# Patient Record
Sex: Male | Born: 1935 | Race: White | Hispanic: No | Marital: Married | Smoking: Never smoker
Health system: Southern US, Community
[De-identification: ages and names within clinical notes are randomized; demographics above are authoritative.]

## PROBLEM LIST (undated history)

## (undated) ENCOUNTER — Emergency Department (HOSPITAL_COMMUNITY): Admission: EM | Payer: Medicare Other

## (undated) DIAGNOSIS — I739 Peripheral vascular disease, unspecified: Secondary | ICD-10-CM

## (undated) DIAGNOSIS — I639 Cerebral infarction, unspecified: Secondary | ICD-10-CM

## (undated) DIAGNOSIS — E785 Hyperlipidemia, unspecified: Secondary | ICD-10-CM

## (undated) DIAGNOSIS — I10 Essential (primary) hypertension: Secondary | ICD-10-CM

## (undated) DIAGNOSIS — J45909 Unspecified asthma, uncomplicated: Secondary | ICD-10-CM

## (undated) DIAGNOSIS — M1990 Unspecified osteoarthritis, unspecified site: Secondary | ICD-10-CM

## (undated) DIAGNOSIS — N400 Enlarged prostate without lower urinary tract symptoms: Secondary | ICD-10-CM

## (undated) DIAGNOSIS — E039 Hypothyroidism, unspecified: Secondary | ICD-10-CM

## (undated) DIAGNOSIS — E782 Mixed hyperlipidemia: Secondary | ICD-10-CM

## (undated) DIAGNOSIS — I253 Aneurysm of heart: Secondary | ICD-10-CM

## (undated) DIAGNOSIS — I779 Disorder of arteries and arterioles, unspecified: Secondary | ICD-10-CM

## (undated) HISTORY — DX: Disorder of arteries and arterioles, unspecified: I77.9

## (undated) HISTORY — DX: Aneurysm of heart: I25.3

## (undated) HISTORY — DX: Peripheral vascular disease, unspecified: I73.9

## (undated) HISTORY — DX: Essential (primary) hypertension: I10

## (undated) HISTORY — PX: ABDOMINAL SURGERY: SHX537

## (undated) HISTORY — DX: Mixed hyperlipidemia: E78.2

## (undated) HISTORY — DX: Benign prostatic hyperplasia without lower urinary tract symptoms: N40.0

## (undated) HISTORY — PX: PROSTATE SURGERY: SHX751

---

## 2004-08-31 ENCOUNTER — Ambulatory Visit (HOSPITAL_COMMUNITY): Admission: RE | Admit: 2004-08-31 | Discharge: 2004-08-31 | Payer: Self-pay | Admitting: Family Medicine

## 2004-09-09 ENCOUNTER — Ambulatory Visit: Payer: Self-pay | Admitting: *Deleted

## 2009-11-16 ENCOUNTER — Ambulatory Visit (HOSPITAL_COMMUNITY): Admission: RE | Admit: 2009-11-16 | Discharge: 2009-11-16 | Payer: Self-pay | Admitting: Family Medicine

## 2010-04-18 ENCOUNTER — Ambulatory Visit (HOSPITAL_COMMUNITY)
Admission: RE | Admit: 2010-04-18 | Discharge: 2010-04-18 | Payer: Self-pay | Source: Home / Self Care | Admitting: Family Medicine

## 2010-06-28 ENCOUNTER — Ambulatory Visit (HOSPITAL_COMMUNITY)
Admission: RE | Admit: 2010-06-28 | Discharge: 2010-06-28 | Payer: Self-pay | Source: Home / Self Care | Attending: Family Medicine | Admitting: Family Medicine

## 2010-10-28 NOTE — Procedures (Signed)
NAME:  [name], [name] NO.:  [id number]   MEDICAL RECORD NO.:  [id number]          PATIENT TYPE:  OUT   LOCATION:  RAD                           FACILITY:  [facility]   PHYSICIAN:  Nicki Guadalajara, M.D.     DATE OF BIRTH:  05-14-1933   DATE OF PROCEDURE:  08/31/2004  DATE OF DISCHARGE:                                  ECHOCARDIOGRAM   INDICATION:  [name] is a 77 year old gentleman, patient of Dr.  Renard Matter, who has a history of hypertension, recent syncope, and dizziness.  He is referred for 2-D echo Doppler study.   1.  Technically inadequate M-mode, 2-dimensional, and comprehensive Doppler      study.  2.  There was evidence for concentric left ventricular hypertrophy with      septal wall thickness measuring 1.7 cm, and posterior wall thickness      measuring 1.5 cm, placing it in a moderate range. Left ventricular end      diastolic and end systolic dimensions are normal at 3.4 and 2.7 cm,      respectively. There is normal systolic function with an estimated      ejection fraction of approximately 55%. The mitral inflow signal      suggests a grade 1 diastolic dysfunction.  3.  Left atrium is upper normal in size, measuring 3.9 cm. Right atrium and      right ventricle are normal.  4.  Aortic root dimension was normal at 3.2 cm.  5.  The aortic valve was trileaflet. There was commissural thickening of the      leaflets with trace aortic regurgitation. Peak instantaneous gradient is      10 mm, mean gradient is 6 mm compatible with aortic sclerosis, minimal      aortic stenosis.  6.  There is a suggestion of mild mitral valve prolapse with trace mitral      regurgitation.  7.  Tricuspid valve is structurally normal. Pulmonic valve is structurally      normal.  8.  There are no intramyocardial masses, thrombi, or effusions.   IMPRESSION:  Technically inadequate echo Doppler study demonstrating  moderate concentric left ventricular hypertrophy with normal  systolic  function, but evidence for mild grade 1 diastolic dysfunction, and an  estimated ejection fraction of 55%. There is evidence for aortic valve  sclerosis without significant stenosis with commissural thickening of a  trileaflet valve with trace aortic insufficiency. There is a suggestion of  mild mitral valve prolapse with trace mitral regurgitation.     TK/MEDQ  D:  08/31/2004  T:  09/01/2004  Job:  [id number]   cc:   Angus G. Renard Matter, MD  [address]  [place]  [place] [address]  Fax: [phone]

## 2011-03-07 ENCOUNTER — Ambulatory Visit (HOSPITAL_COMMUNITY)
Admission: RE | Admit: 2011-03-07 | Discharge: 2011-03-07 | Disposition: A | Discharge status: Home / Self Care | Payer: Medicare Other | Source: Ambulatory Visit | Attending: Family Medicine | Admitting: Family Medicine

## 2011-03-07 ENCOUNTER — Other Ambulatory Visit (HOSPITAL_COMMUNITY): Payer: Self-pay | Admitting: Family Medicine

## 2011-03-07 DIAGNOSIS — R52 Pain, unspecified: Secondary | ICD-10-CM

## 2011-03-07 DIAGNOSIS — M25569 Pain in unspecified knee: Secondary | ICD-10-CM | POA: Insufficient documentation

## 2011-03-07 DIAGNOSIS — M25469 Effusion, unspecified knee: Secondary | ICD-10-CM | POA: Insufficient documentation

## 2011-05-07 ENCOUNTER — Other Ambulatory Visit: Payer: Self-pay

## 2011-05-07 ENCOUNTER — Emergency Department (HOSPITAL_COMMUNITY): Payer: Medicare Other

## 2011-05-07 ENCOUNTER — Encounter: Payer: Self-pay | Admitting: *Deleted

## 2011-05-07 ENCOUNTER — Inpatient Hospital Stay (HOSPITAL_COMMUNITY)
Admission: EM | Admit: 2011-05-07 | Discharge: 2011-05-10 | DRG: 066 | Disposition: A | Discharge status: Home Health | Payer: Medicare Other | Attending: Internal Medicine | Admitting: Internal Medicine

## 2011-05-07 DIAGNOSIS — E039 Hypothyroidism, unspecified: Secondary | ICD-10-CM | POA: Diagnosis present

## 2011-05-07 DIAGNOSIS — Z8673 Personal history of transient ischemic attack (TIA), and cerebral infarction without residual deficits: Secondary | ICD-10-CM

## 2011-05-07 DIAGNOSIS — N289 Disorder of kidney and ureter, unspecified: Secondary | ICD-10-CM | POA: Diagnosis present

## 2011-05-07 DIAGNOSIS — I639 Cerebral infarction, unspecified: Secondary | ICD-10-CM

## 2011-05-07 DIAGNOSIS — R209 Unspecified disturbances of skin sensation: Secondary | ICD-10-CM | POA: Diagnosis present

## 2011-05-07 DIAGNOSIS — I10 Essential (primary) hypertension: Secondary | ICD-10-CM | POA: Diagnosis present

## 2011-05-07 DIAGNOSIS — E890 Postprocedural hypothyroidism: Secondary | ICD-10-CM | POA: Diagnosis present

## 2011-05-07 DIAGNOSIS — Z7982 Long term (current) use of aspirin: Secondary | ICD-10-CM

## 2011-05-07 DIAGNOSIS — I6529 Occlusion and stenosis of unspecified carotid artery: Secondary | ICD-10-CM | POA: Diagnosis present

## 2011-05-07 DIAGNOSIS — I6340 Cerebral infarction due to embolism of unspecified cerebral artery: Principal | ICD-10-CM | POA: Diagnosis present

## 2011-05-07 DIAGNOSIS — N400 Enlarged prostate without lower urinary tract symptoms: Secondary | ICD-10-CM | POA: Diagnosis present

## 2011-05-07 HISTORY — DX: Cerebral infarction, unspecified: I63.9

## 2011-05-07 HISTORY — DX: Hypothyroidism, unspecified: E03.9

## 2011-05-07 LAB — PROTIME-INR: Prothrombin Time: 14.9 seconds (ref 11.6–15.2)

## 2011-05-07 LAB — DIFFERENTIAL
Basophils Relative: 1 % (ref 0–1)
Eosinophils Absolute: 0.3 10*3/uL (ref 0.0–0.7)
Lymphs Abs: 2.6 10*3/uL (ref 0.7–4.0)
Monocytes Relative: 9 % (ref 3–12)
Neutro Abs: 3.8 10*3/uL (ref 1.7–7.7)
Neutrophils Relative %: 52 % (ref 43–77)

## 2011-05-07 LAB — CBC
Hemoglobin: 15.3 g/dL (ref 13.0–17.0)
Platelets: 213 10*3/uL (ref 150–400)
RBC: 4.93 MIL/uL (ref 4.22–5.81)

## 2011-05-07 LAB — COMPREHENSIVE METABOLIC PANEL
ALT: 29 U/L (ref 0–53)
Albumin: 3.8 g/dL (ref 3.5–5.2)
Alkaline Phosphatase: 59 U/L (ref 39–117)
BUN: 28 mg/dL — ABNORMAL HIGH (ref 6–23)
Chloride: 103 mEq/L (ref 96–112)
Glucose, Bld: 101 mg/dL — ABNORMAL HIGH (ref 70–99)
Potassium: 4 mEq/L (ref 3.5–5.1)
Sodium: 136 mEq/L (ref 135–145)
Total Bilirubin: 0.3 mg/dL (ref 0.3–1.2)
Total Protein: 6.6 g/dL (ref 6.0–8.3)

## 2011-05-07 NOTE — ED Notes (Signed)
CT complete, pt waiting on tele-neurology consult. Camera in room at this time.

## 2011-05-07 NOTE — Consult Note (Signed)
Reason for Consult:  Stroke Referring Physician: [facility]  Leonard Lindsey is an 77 y.o. male.  HPI: Patient had acute onset of left arm numbness and incoordination at about 6 pm.  There was no facial involvement.  The wife says that his gait appeared unsteady and he was walking into the walls.  He has a history of stroke 5 years ago at which time his gait was unsteady as well.  He was fully compliant with ASA prophylaxis when this occurred.  He went to [facility] where a telestroke consult without another neurologist was performed.  He was deemed not to be a tpa candidate due to recent hematuria within the last week.   CT scan of the brain was obtained which I reviewed personally.  There is a large right parietal subcortical hypodensity, which has some acute features but may be old as well.  No hemorrhage is present.  CBC, CMP, and coag are normal except for slightly elevated BUN 28 and Cr 1.37.    Past Medical History  Diagnosis Date  . Hypertension   . Stroke   . Hypothyroid     History reviewed. No pertinent past surgical history.  History reviewed. No pertinent family history.  Social History:  reports that he has never smoked. He does not have any smokeless tobacco history on file. He reports that he does not drink alcohol or use illicit drugs.  Allergies: No Known Allergies  Prior to Admission medications   Medication Sig Start Date End Date Taking? Authorizing Provider  aspirin 81 MG tablet Take 162 mg by mouth at bedtime.    Yes Historical Provider, MD  atorvastatin (LIPITOR) 40 MG tablet Take 40 mg by mouth at bedtime.     Yes Historical Provider, MD  cyanocobalamin (,VITAMIN B-12,) 1000 MCG/ML injection Inject 1,000 mcg into the muscle every 30 (thirty) days.     Yes Historical Provider, MD  diltiazem (CARDIZEM CD) 180 MG 24 hr capsule Take 180 mg by mouth every morning.     Yes Historical Provider, MD  dutasteride (AVODART) 0.5 MG capsule Take 0.5 mg by mouth at  bedtime.    Yes Historical Provider, MD  ibuprofen (ADVIL,MOTRIN) 800 MG tablet Take 800 mg by mouth 2 (two) times daily.    Yes Historical Provider, MD  levothyroxine (SYNTHROID, LEVOTHROID) 137 MCG tablet Take 137 mcg by mouth every morning.    Yes Historical Provider, MD  ramipril (ALTACE) 1.25 MG capsule Take 1.25 mg by mouth every morning.    Yes Historical Provider, MD  Tamsulosin HCl (FLOMAX) 0.4 MG CAPS Take 0.4 mg by mouth at bedtime.    Yes Historical Provider, MD  triamterene-hydrochlorothiazide (DYAZIDE) 37.5-25 MG per capsule Take 1 capsule by mouth every morning.     Yes Historical Provider, MD    Medications: I have reviewed the patient's current medications.  Results for orders placed during the hospital encounter of 05/07/11 (from the past 48 hour(s))  CBC     Status: Normal   Collection Time   05/07/11  7:00 PM      Component Value Range Comment   WBC 7.4  4.0 - 10.5 (K/uL)    RBC 4.93  4.22 - 5.81 (MIL/uL)    Hemoglobin 15.3  13.0 - 17.0 (g/dL)    HCT 19.1  47.8 - 29.5 (%)    MCV 90.1  78.0 - 100.0 (fL)    MCH 31.0  26.0 - 34.0 (pg)    MCHC 34.5  30.0 - 36.0 (g/dL)    RDW 16.1  09.6 - 04.5 (%)    Platelets 213  150 - 400 (K/uL)   DIFFERENTIAL     Status: Normal   Collection Time   05/07/11  7:00 PM      Component Value Range Comment   Neutrophils Relative 52  43 - 77 (%)    Neutro Abs 3.8  1.7 - 7.7 (K/uL)    Lymphocytes Relative 35  12 - 46 (%)    Lymphs Abs 2.6  0.7 - 4.0 (K/uL)    Monocytes Relative 9  3 - 12 (%)    Monocytes Absolute 0.7  0.1 - 1.0 (K/uL)    Eosinophils Relative 3  0 - 5 (%)    Eosinophils Absolute 0.3  0.0 - 0.7 (K/uL)    Basophils Relative 1  0 - 1 (%)    Basophils Absolute 0.1  0.0 - 0.1 (K/uL)   COMPREHENSIVE METABOLIC PANEL     Status: Abnormal   Collection Time   05/07/11  7:00 PM      Component Value Range Comment   Sodium 136  135 - 145 (mEq/L)    Potassium 4.0  3.5 - 5.1 (mEq/L)    Chloride 103  96 - 112 (mEq/L)    CO2 25   19 - 32 (mEq/L)    Glucose, Bld 101 (*) 70 - 99 (mg/dL)    BUN 28 (*) 6 - 23 (mg/dL)    Creatinine, Ser 4.09 (*) 0.50 - 1.35 (mg/dL)    Calcium 9.3  8.4 - 10.5 (mg/dL)    Total Protein 6.6  6.0 - 8.3 (g/dL)    Albumin 3.8  3.5 - 5.2 (g/dL)    AST 30  0 - 37 (U/L)    ALT 29  0 - 53 (U/L)    Alkaline Phosphatase 59  39 - 117 (U/L)    Total Bilirubin 0.3  0.3 - 1.2 (mg/dL)    GFR calc non Af Amer 49 (*) >90 (mL/min)    GFR calc Af Amer 56 (*) >90 (mL/min)   PROTIME-INR     Status: Normal   Collection Time   05/07/11  7:00 PM      Component Value Range Comment   Prothrombin Time 14.9  11.6 - 15.2 (seconds)    INR 1.15  0.00 - 1.49    APTT     Status: Normal   Collection Time   05/07/11  7:00 PM      Component Value Range Comment   aPTT 32  24 - 37 (seconds)     Ct Head Wo Contrast  05/07/2011  *RADIOLOGY REPORT*  Clinical Data: Code stroke.  Left arm numbness.  CT HEAD WITHOUT CONTRAST  Technique:  Contiguous axial images were obtained from the base of the skull through the vertex without contrast.  Comparison: 08/31/2004.  Findings: Atrophy and chronic ischemic white matter disease is present.  There is low attenuation posterior to the body and atrium of the right lateral ventricle in the right parietal lobe, suspicious for infarction.  Chronic ischemic white matter disease could mimic this appearance.  No hemorrhage is identified.  Mastoid air cells appear clear.  Paranasal sinuses clear.  No mass effect or midline shift. Scattered small lacunar infarcts are present in the sub insular regions.  IMPRESSION: 1.  Periventricular low attenuation in the right parietal lobe could represent acute or subacute infarct or chronic ischemic change but was not present on the prior  exam of 2006. Critical Value/emergent results were called by telephone at the time of interpretation on 05/07/2011  at 1914 hours  to  Dr. Adriana Simas, who verbally acknowledged these results. 2.  Atrophy and chronic ischemic white  matter disease.  Original Report Authenticated By: Andreas Newport, M.D.    @ROS @ Blood pressure 115/54, pulse 61, temperature 98.3 F (36.8 C), temperature source Oral, resp. rate 16, height 5\' 8"  (1.727 m), weight 104.327 kg (230 lb), SpO2 97.00%. Neurologic Examination: Awake, alert, oriented.  Language-fluent.  Comprehension, naming, and repetition is normal. PERLA, EOMI, face symmetric, tongue midline. Strength 5/5 bilateral upper and lower extremities. Coordination-left finger to nose dysmetria and milder left heel to shin as well. Hyperalgesia in the left upper extremity and decreased pinprick in the left lower extremity. Gait-deferred due to safety reasons. No babinski.  No hoffman's sign.    Assessment/Plan:  77 yo man with 2 main risk factors for stroke who presents with acute onset of incoordination in the left arm and leg, as well as some sensory disturbance in that location.  The right parietal hypodensity may represent an acute stroke explaining some of the sensory changes, but not likely to see so early on CT.  Therefore, may be an old stroke.  The patient's symptoms are also in line with probable left cerebellar infarct which would not be seen so early on CT.    I recommend doing an MRI brain without contrast to assess for acute stroke and location.  I also recommend carotid doppler ultrasound and transthoracic echocardiogram.  Since he was compliant with ASA, that should be discontinued, and I recommend switch to Plavix 75 mg/day.  I recommend a fasting lipid panel in the am and adjustment of the statin dose based on that.  I recommend PT/OT evaluation and treatment.  Since his blood pressure is on the lower side for someone with an acute stroke, I recommend holding altace for the first 24-48 hours of admission.  The dyazide and diltiazem can be continued as before.  The patient should be placed on telemetry.  If paroxysmal atrial fibrillation/flutter is found or TTE  shows an intracardiac source of embolism, then anticoagulation with coumadin or pradaxa can be initiated.      Patient Active Hospital Problem List: No active hospital problems.    Weston Settle, MD 05/07/2011, 11:22 PM

## 2011-05-07 NOTE — ED Provider Notes (Signed)
Scribed for Leonard Hutching, MD, the patient was seen in room [facility] . This chart was scribed by Ellie Lunch.    CSN: [id number] Arrival date & time: 05/07/2011  6:57 PM   First MD Initiated Contact with Patient 05/07/11 1853      Chief Complaint  Patient presents with  . Code Stroke    (Consider location/radiation/quality/duration/timing/severity/associated sxs/prior treatment) The history is provided by the patient and the spouse. No language interpreter was used.   Leonard Lindsey is a 77 y.o. male who presents to the Emergency Department complaining of numbness in the left arm occuring suddenly 30 minutes ago. Pt was at baseline prior to onset. Left arm numbness is described as constant and is associated with difficulty with spatial orientation of L arm and  moving his left arm purposefully.  Pt denies any difficulty thinking or any speech disturbance. Pt has h/o previous stroke ~5 years ago. Gait was affected by previous stroke, but has since improved. Level V caveat urgent need for intervention  Past Medical History  Diagnosis Date  . Hypertension   . Stroke   . Hypothyroid     History reviewed. No pertinent past surgical history.  History reviewed. No pertinent family history.  History  Substance Use Topics  . Smoking status: Never Smoker   . Smokeless tobacco: Not on file  . Alcohol Use: No  Pt retired to [place] with his wife.    Review of Systems  Unable to perform ROS: Other   10 Systems reviewed and are negative for acute change except as noted in the HPI.   Allergies  Review of patient's allergies indicates no known allergies.  Home Medications   Current Outpatient Rx  Name Route Sig Dispense Refill  . ASPIRIN 81 MG PO TABS Oral Take 162 mg by mouth daily.      . DUTASTERIDE 0.5 MG PO CAPS Oral Take 0.5 mg by mouth daily.      Marland Kitchen LEVOTHYROXINE SODIUM 137 MCG PO TABS Oral Take 137 mcg by mouth daily.      Marland Kitchen RAMIPRIL 1.25 MG PO CAPS Oral Take by  mouth daily.      Marland Kitchen TAMSULOSIN HCL 0.4 MG PO CAPS Oral Take by mouth.        BP 149/89  Pulse 69  Temp(Src) 98.6 F (37 C) (Oral)  Resp 22  Ht 5\' 8"  (1.727 m)  Wt 230 lb (104.327 kg)  BMI 34.97 kg/m2  SpO2 95%  Physical Exam  Nursing note and vitals reviewed. Constitutional: He is oriented to person, place, and time. He appears well-developed and well-nourished.  HENT:  Head: Normocephalic and atraumatic.  Eyes: Conjunctivae and EOM are normal. Pupils are equal, round, and reactive to light.  Neck: Normal range of motion. Neck supple.  Cardiovascular: Normal rate and regular rhythm.   Pulmonary/Chest: Effort normal and breath sounds normal.  Abdominal: Soft. Bowel sounds are normal.  Neurological: He is alert and oriented to person, place, and time.       LUE  4/5 strength. The pt. Has difficulty moving L arm purposefully.  Skin: Skin is warm and dry.  Psychiatric: He has a normal mood and affect.    ED Course  Procedures (including critical care time)  DIAGNOSTIC STUDIES: Oxygen Saturation is 95% on room air, adequate by my interpretation.    Date: 05/07/2011  Rate: 72  Rhythm: normal sinus rhythm  QRS Axis: normal  Intervals: normal  ST/T Wave abnormalities: normal  Conduction Disutrbances:none  Narrative Interpretation:   Old EKG Reviewed: none available  COORDINATION OF CARE:  Labs Reviewed  COMPREHENSIVE METABOLIC PANEL - Abnormal; Notable for the following:    Glucose, Bld 101 (*)    BUN 28 (*)    Creatinine, Ser 1.37 (*)    GFR calc non Af Amer 49 (*)    GFR calc Af Amer 56 (*)    All other components within normal limits  CBC  DIFFERENTIAL  PROTIME-INR  APTT   Ct Head Wo Contrast  05/07/2011  *RADIOLOGY REPORT*  Clinical Data: Code stroke.  Left arm numbness.  CT HEAD WITHOUT CONTRAST  Technique:  Contiguous axial images were obtained from the base of the skull through the vertex without contrast.  Comparison: 08/31/2004.  Findings: Atrophy and  chronic ischemic white matter disease is present.  There is low attenuation posterior to the body and atrium of the right lateral ventricle in the right parietal lobe, suspicious for infarction.  Chronic ischemic white matter disease could mimic this appearance.  No hemorrhage is identified.  Mastoid air cells appear clear.  Paranasal sinuses clear.  No mass effect or midline shift. Scattered small lacunar infarcts are present in the sub insular regions.  IMPRESSION: 1.  Periventricular low attenuation in the right parietal lobe could represent acute or subacute infarct or chronic ischemic change but was not present on the prior exam of 2006. Critical Value/emergent results were called by telephone at the time of interpretation on 05/07/2011  at 1914 hours  to  Dr. Adriana Simas, who verbally acknowledged these results. 2.  Atrophy and chronic ischemic white matter disease.  Original Report Authenticated By: Andreas Newport, M.D.   7:00 p.m. EDP discussed with Pt and family plan to consult neurology, get head CT and treat as Stroke.   7:09 p.m. Physician Consult with neurology.   No diagnosis found.  CRITICAL CARE Performed by: Leonard Lindsey   Total critical care time: 30  Critical care time was exclusive of separately billable procedures and treating other patients.  Critical care was necessary to treat or prevent imminent or life-threatening deterioration.  Critical care was time spent personally by me on the following activities: development of treatment plan with patient and/or surrogate as well as nursing, discussions with consultants, evaluation of patient's response to treatment, examination of patient, obtaining history from patient or surrogate, ordering and performing treatments and interventions, ordering and review of laboratory studies, ordering and review of radiographic studies, pulse oximetry and re-evaluation of patient's condition.  MDM   history and physical exam consistent with acute CVA.  Discussed with neurologist. Will not give TPA at this time. Patient is hemodynamically stable. Transferred to [facility]   I personally performed the services described in this documentation, which was scribed in my presence. The recorded information has been reviewed and considered.         Leonard Hutching, MD 05/07/11 2000

## 2011-05-07 NOTE — ED Provider Notes (Signed)
History     CSN: [id number] Arrival date & time: 05/07/2011  6:57 PM   First MD Initiated Contact with Patient 05/07/11 1853      Chief Complaint  Patient presents with  . Code Stroke    (Consider location/radiation/quality/duration/timing/severity/associated sxs/prior treatment) Patient is a 77 y.o. male presenting with extremity weakness. The history is provided by the patient.  Extremity Weakness This is a new problem.   patient was transferred from an apparent hospital with a stroke. One hour prior to arrival there he developed trouble using his left upper extremity, and numbness/paresthesias in his left upper extremity. He also is having some trouble walking. He was seen up there had a neurology consult by telemetry neurology. He was transferred down here to see neurology and medicine admission. Complete H&P done by Dr. Adriana Simas at that hospital  Past Medical History  Diagnosis Date  . Hypertension   . Stroke   . Hypothyroid     History reviewed. No pertinent past surgical history.  History reviewed. No pertinent family history.  History  Substance Use Topics  . Smoking status: Never Smoker   . Smokeless tobacco: Not on file  . Alcohol Use: No      Review of Systems  Constitutional: Negative for appetite change.  Musculoskeletal: Positive for extremity weakness.  Neurological: Positive for weakness and numbness.    Allergies  Review of patient's allergies indicates no known allergies.  Home Medications   Current Outpatient Rx  Name Route Sig Dispense Refill  . ASPIRIN 81 MG PO TABS Oral Take 162 mg by mouth at bedtime.     . ATORVASTATIN CALCIUM 40 MG PO TABS Oral Take 40 mg by mouth at bedtime.      . CYANOCOBALAMIN 1000 MCG/ML IJ SOLN Intramuscular Inject 1,000 mcg into the muscle every 30 (thirty) days.      Marland Kitchen DILTIAZEM HCL COATED BEADS 180 MG PO CP24 Oral Take 180 mg by mouth every morning.      Marland Kitchen DUTASTERIDE 0.5 MG PO CAPS Oral Take 0.5 mg by mouth at  bedtime.     . IBUPROFEN 800 MG PO TABS Oral Take 800 mg by mouth 2 (two) times daily.     Marland Kitchen LEVOTHYROXINE SODIUM 137 MCG PO TABS Oral Take 137 mcg by mouth every morning.     Marland Kitchen RAMIPRIL 1.25 MG PO CAPS Oral Take 1.25 mg by mouth every morning.     Marland Kitchen TAMSULOSIN HCL 0.4 MG PO CAPS Oral Take 0.4 mg by mouth at bedtime.     . TRIAMTERENE-HCTZ 37.5-25 MG PO CAPS Oral Take 1 capsule by mouth every morning.        BP 140/68  Pulse 62  Temp(Src) 98.3 F (36.8 C) (Oral)  Resp 16  Ht 5\' 8"  (1.727 m)  Wt 230 lb (104.327 kg)  BMI 34.97 kg/m2  SpO2 96%  Physical Exam  Constitutional: He appears well-developed and well-nourished.  Cardiovascular: Normal rate and regular rhythm.   Neurological: He is alert.       Mild difficulty using his left upper extremity. Help more against his body than the right.    ED Course  Procedures (including critical care time)  Labs Reviewed  COMPREHENSIVE METABOLIC PANEL - Abnormal; Notable for the following:    Glucose, Bld 101 (*)    BUN 28 (*)    Creatinine, Ser 1.37 (*)    GFR calc non Af Amer 49 (*)    GFR calc Af Amer 56 (*)  All other components within normal limits  CBC  DIFFERENTIAL  PROTIME-INR  APTT   Ct Head Wo Contrast  05/07/2011  *RADIOLOGY REPORT*  Clinical Data: Code stroke.  Left arm numbness.  CT HEAD WITHOUT CONTRAST  Technique:  Contiguous axial images were obtained from the base of the skull through the vertex without contrast.  Comparison: 08/31/2004.  Findings: Atrophy and chronic ischemic white matter disease is present.  There is low attenuation posterior to the body and atrium of the right lateral ventricle in the right parietal lobe, suspicious for infarction.  Chronic ischemic white matter disease could mimic this appearance.  No hemorrhage is identified.  Mastoid air cells appear clear.  Paranasal sinuses clear.  No mass effect or midline shift. Scattered small lacunar infarcts are present in the sub insular regions.   IMPRESSION: 1.  Periventricular low attenuation in the right parietal lobe could represent acute or subacute infarct or chronic ischemic change but was not present on the prior exam of 2006. Critical Value/emergent results were called by telephone at the time of interpretation on 05/07/2011  at 1914 hours  to  Dr. Adriana Simas, who verbally acknowledged these results. 2.  Atrophy and chronic ischemic white matter disease.  Original Report Authenticated By: Andreas Newport, M.D.     1. Cerebrovascular accident       MDM  [facility] from [facility] with her acute stroke. He is not TPA candidate. He had a [facility] neurology consult there and has been seen by the neurologist in the ER here. He'll be admitted to medicine. Complete H&P physical exam done by Dr. Adriana Simas.        Juliet Rude. Rubin Payor, MD 05/07/11 [phone]

## 2011-05-07 NOTE — ED Notes (Signed)
Transferred from [facility] for left arm numbness. Patient is alert and oriented x4, resp even unlabored. Skin w/d. C/O numbness left arm.

## 2011-05-07 NOTE — ED Notes (Signed)
Teleneurology consult completed  

## 2011-05-07 NOTE — ED Notes (Signed)
Family at bedside. Patient is comfortable. Paramedics are here to pick up patient. Patient is aware of being transferred to [facility] & family is here.

## 2011-05-07 NOTE — ED Notes (Signed)
Per family, pt began having left sided weakness about 6pm.  Difficulty in ambulation.  Pt c/o numbness of left side at this time, purposeful movement, but spatial disorientation noted.

## 2011-05-08 ENCOUNTER — Inpatient Hospital Stay (HOSPITAL_COMMUNITY): Payer: Medicare Other

## 2011-05-08 ENCOUNTER — Encounter (HOSPITAL_COMMUNITY): Payer: Self-pay | Admitting: Internal Medicine

## 2011-05-08 DIAGNOSIS — I10 Essential (primary) hypertension: Secondary | ICD-10-CM | POA: Diagnosis present

## 2011-05-08 DIAGNOSIS — I639 Cerebral infarction, unspecified: Secondary | ICD-10-CM | POA: Insufficient documentation

## 2011-05-08 DIAGNOSIS — E890 Postprocedural hypothyroidism: Secondary | ICD-10-CM | POA: Diagnosis present

## 2011-05-08 DIAGNOSIS — I6350 Cerebral infarction due to unspecified occlusion or stenosis of unspecified cerebral artery: Secondary | ICD-10-CM

## 2011-05-08 LAB — CREATININE, SERUM
Creatinine, Ser: 0.96 mg/dL (ref 0.50–1.35)
GFR calc Af Amer: >90 mL/min (ref >90)
GFR calc non Af Amer: 79 mL/min — ABNORMAL LOW (ref >90)

## 2011-05-08 LAB — GLUCOSE, CAPILLARY: Glucose-Capillary: 100 mg/dL — ABNORMAL HIGH (ref 70–99)

## 2011-05-08 LAB — HEMOGLOBIN A1C: Mean Plasma Glucose: 123 mg/dL — ABNORMAL HIGH (ref <117)

## 2011-05-08 MED ORDER — DILTIAZEM HCL ER COATED BEADS 180 MG PO CP24
180.0000 mg | ORAL_CAPSULE | ORAL | Status: DC
  Administered 2011-05-08 – 2011-05-10 (×3): 180 mg via ORAL
  Filled 2011-05-08 (×4): qty 1

## 2011-05-08 MED ORDER — TAMSULOSIN HCL 0.4 MG PO CAPS
0.4000 mg | ORAL_CAPSULE | Freq: Every day | ORAL | Status: DC
Start: 2011-05-08 — End: 2011-05-10
  Administered 2011-05-08 – 2011-05-09 (×2): 0.4 mg via ORAL
  Filled 2011-05-08 (×4): qty 1

## 2011-05-08 MED ORDER — ROSUVASTATIN CALCIUM 20 MG PO TABS
20.0000 mg | ORAL_TABLET | Freq: Every day | ORAL | Status: DC
  Administered 2011-05-08 – 2011-05-10 (×3): 20 mg via ORAL
  Filled 2011-05-08 (×3): qty 1

## 2011-05-08 MED ORDER — LABETALOL HCL 5 MG/ML IV SOLN: 10.0000 mg | INTRAVENOUS | Status: DC | PRN

## 2011-05-08 MED ORDER — CYANOCOBALAMIN 1000 MCG/ML IJ SOLN: 1000.0000 ug | INTRAMUSCULAR | Status: DC

## 2011-05-08 MED ORDER — RAMIPRIL 1.25 MG PO CAPS: 1.2500 mg | ORAL_CAPSULE | ORAL | Status: DC

## 2011-05-08 MED ORDER — LEVOTHYROXINE SODIUM 137 MCG PO TABS
137.0000 ug | ORAL_TABLET | ORAL | Status: DC
  Administered 2011-05-08 – 2011-05-10 (×3): 137 ug via ORAL
  Filled 2011-05-08 (×4): qty 1

## 2011-05-08 MED ORDER — SENNOSIDES-DOCUSATE SODIUM 8.6-50 MG PO TABS: 1.0000 | ORAL_TABLET | Freq: Every evening | ORAL | Status: DC | PRN

## 2011-05-08 MED ORDER — DUTASTERIDE 0.5 MG PO CAPS
0.5000 mg | ORAL_CAPSULE | Freq: Every day | ORAL | Status: DC
  Administered 2011-05-08 – 2011-05-09 (×2): 0.5 mg via ORAL
  Filled 2011-05-08 (×3): qty 1

## 2011-05-08 MED ORDER — ENOXAPARIN SODIUM 40 MG/0.4ML ~~LOC~~ SOLN
40.0000 mg | SUBCUTANEOUS | Status: DC
  Administered 2011-05-08 – 2011-05-10 (×3): 40 mg via SUBCUTANEOUS
  Filled 2011-05-08 (×4): qty 0.4

## 2011-05-08 MED ORDER — SODIUM CHLORIDE 0.9 % IV SOLN
INTRAVENOUS | Status: DC
  Administered 2011-05-08 – 2011-05-09 (×2): via INTRAVENOUS
  Administered 2011-05-09: 500 mL via INTRAVENOUS

## 2011-05-08 MED ORDER — ONDANSETRON HCL 4 MG/2ML IJ SOLN: 4.0000 mg | Freq: Four times a day (QID) | INTRAMUSCULAR | Status: DC | PRN

## 2011-05-08 MED ORDER — CLOPIDOGREL BISULFATE 75 MG PO TABS
75.0000 mg | ORAL_TABLET | Freq: Every day | ORAL | Status: DC
  Administered 2011-05-08 – 2011-05-10 (×3): 75 mg via ORAL
  Filled 2011-05-08 (×4): qty 1

## 2011-05-08 NOTE — H&P (Signed)
PCP:   Alice Reichert, MD   Chief Complaint:  Left upper extremity numbness and weakness  HPI: The patient is a 77 year old white male with past medical history significant for a stroke about 5 years ago, and hypertension who presents with above complaints. She states that at about 6 PM this evening he began having numbness and tingling in his left upper extremity, and also had problems with his balance-he was staggering and was walking/bumping into walls with his left side. he states oh he has been taking aspirin since his last stroke. He was seen at [facility] initially and a CT scan of his brain was done and showed a periventricular low attenuation in the right parietal lobe could represent acute or subacute infarct or chronic ischemic change but was not present on the prior exam of 2006. The patient admitted to recent hematuria and so not a TPA candidate per neurology. Phone consultation was done with the neurologist here was done and transfer to [facility] of admission requested per Neuro. he denies headaches, dysphagia, and no slurred speech.   Review of Systems:  The patient denies anorexia, fever, weight loss,, vision loss, decreased hearing, hoarseness, chest pain, syncope, dyspnea on exertion,   hemoptysis, abdominal pain, melena, hematochezia, severe indigestion/heartburn, incontinence, suspicious skin lesions, transient blindness, depression, unusual weight change.  Past Medical History: Past Medical History  Diagnosis Date  . Hypertension   . Stroke 37yrs ago  . Hypothyroid    History reviewed. No pertinent past surgical history.  Medications: Prior to Admission medications   Medication Sig Start Date End Date Taking? Authorizing Provider  aspirin 81 MG tablet Take 162 mg by mouth at bedtime.    Yes Historical Provider, MD  atorvastatin (LIPITOR) 40 MG tablet Take 40 mg by mouth at bedtime.     Yes Historical Provider, MD  cyanocobalamin (,VITAMIN B-12,) 1000  MCG/ML injection Inject 1,000 mcg into the muscle every 30 (thirty) days.     Yes Historical Provider, MD  diltiazem (CARDIZEM CD) 180 MG 24 hr capsule Take 180 mg by mouth every morning.     Yes Historical Provider, MD  dutasteride (AVODART) 0.5 MG capsule Take 0.5 mg by mouth at bedtime.    Yes Historical Provider, MD  ibuprofen (ADVIL,MOTRIN) 800 MG tablet Take 800 mg by mouth 2 (two) times daily.    Yes Historical Provider, MD  levothyroxine (SYNTHROID, LEVOTHROID) 137 MCG tablet Take 137 mcg by mouth every morning.    Yes Historical Provider, MD  ramipril (ALTACE) 1.25 MG capsule Take 1.25 mg by mouth every morning.    Yes Historical Provider, MD  Tamsulosin HCl (FLOMAX) 0.4 MG CAPS Take 0.4 mg by mouth at bedtime.    Yes Historical Provider, MD  triamterene-hydrochlorothiazide (DYAZIDE) 37.5-25 MG per capsule Take 1 capsule by mouth every morning.     Yes Historical Provider, MD    Allergies:  No Known Allergies  Social History:  reports that he has never smoked. He does not have any smokeless tobacco history on file. He reports that he does not drink alcohol or use illicit drugs.  Family History: History reviewed. No pertinent family history.  Physical Exam: Filed Vitals:   05/07/11 2315 05/07/11 2330 05/08/11 0000 05/08/11 0030  BP: 131/70 140/68 134/73 132/68  Pulse: 61 62 65 71  Temp:      TempSrc:      Resp:      Height:      Weight:  SpO2: 97% 96% 96% 97%   BP 132/68  Pulse 71  Temp(Src) 98.3 F (36.8 C) (Oral)  Resp 16  Ht 5\' 8"  (1.727 m)  Wt 104.327 kg (230 lb)  BMI 34.97 kg/m2  SpO2 97%  General Appearance:    Alert, cooperative, no distress, no facial asymmetry   Head:    Normocephalic, without obvious abnormality, atraumatic  Eyes:    PERRL, conjunctiva/corneas clear, EOM's intact.           Throat:   Lips, mucosa, and tongue normal  Neck:   Supple, symmetrical, trachea midline, no adenopathy;       thyroid:  No enlargement/tenderness/nodules; no  carotid   bruit or JVD  Back:     Symmetric, no curvature, ROM normal, no CVA tenderness  Lungs:     Clear to auscultation bilaterally, respirations unlabored  Chest wall:    No tenderness or deformity  Heart:    Regular rate and rhythm, S1 and S2 normal, no murmur, rub   or gallop  Abdomen:     Soft, non-tender, bowel sounds active all four quadrants,    no masses, no organomegaly  Extremities:   Extremities normal, atraumatic, no cyanosis or edema  Pulses:   2+ and symmetric all extremities  Lymph nodes:   Cervical, supraclavicular, and axillary nodes normal  Neurologic:   CNII-XII intact. He withdraws both feet to plantar stimulation, left hand grip is 4/5 but rest of left upper extremity is 5/5, left lower extremity and the right upper and lower extremity 5/5 strength, sensory grossly intact.         Labs on Admission:   [facility] 05/07/11 1900  NA 136  K 4.0  CL 103  CO2 25  GLUCOSE 101*  BUN 28*  CREATININE 1.37*  CALCIUM 9.3  MG --  PHOS --    Basename 05/07/11 1900  AST 30  ALT 29  ALKPHOS 59  BILITOT 0.3  PROT 6.6  ALBUMIN 3.8   No results found for this basename: LIPASE:2,AMYLASE:2 in the last 72 hours  Basename 05/07/11 1900  WBC 7.4  NEUTROABS 3.8  HGB 15.3  HCT 44.4  MCV 90.1  PLT 213   No results found for this basename: CKTOTAL:3,CKMB:3,CKMBINDEX:3,TROPONINI:3 in the last 72 hours No results found for this basename: TSH,T4TOTAL,FREET3,T3FREE,THYROIDAB in the last 72 hours No results found for this basename: VITAMINB12:2,FOLATE:2,FERRITIN:2,TIBC:2,IRON:2,RETICCTPCT:2 in the last 72 hours  Radiological Exams on Admission: Ct Head Wo Contrast  05/07/2011  *RADIOLOGY REPORT*  Clinical Data: Code stroke.  Left arm numbness.  CT HEAD WITHOUT CONTRAST  Technique:  Contiguous axial images were obtained from the base of the skull through the vertex without contrast.  Comparison: 08/31/2004.  Findings: Atrophy and chronic ischemic white matter disease is  present.  There is low attenuation posterior to the body and atrium of the right lateral ventricle in the right parietal lobe, suspicious for infarction.  Chronic ischemic white matter disease could mimic this appearance.  No hemorrhage is identified.  Mastoid air cells appear clear.  Paranasal sinuses clear.  No mass effect or midline shift. Scattered small lacunar infarcts are present in the sub insular regions.  IMPRESSION: 1.  Periventricular low attenuation in the right parietal lobe could represent acute or subacute infarct or chronic ischemic change but was not present on the prior exam of 2006. Critical Value/emergent results were called by telephone at the time of interpretation on 05/07/2011  at 1914 hours  to  Dr. Adriana Simas, who verbally  acknowledged these results. 2.  Atrophy and chronic ischemic white matter disease.  Original Report Authenticated By: Andreas Newport, M.D.    Assessment/Plan Present on Admission:  .CVA (cerebral infarction) -Will obtain CVA w/u-  MRI MRA of Head, carotid Doppler and 2-D echo follow and further manage accordingly. Agree with changing to Plavix as per neurology recommendations as patient has been compliant with his aspirin. PT ,OT and speech therapy consulted. Appreciate neurology input. .Hypertension - continue diltiazem with hold parameters to allow for permissive hypertension this acute stroke period.will hold off on ramipril and diuretic for now follow and resume when appropriate.  .Hypothyroid -Continue Synthroid. .Renal insufficiency -Baseline unknown, will hold off diuretic and ace inhibitor aa above, hydrate follow recheck and further evaluate accordingly if not improving.   Maudell Stanbrough C 05/08/2011, 12:54 AM

## 2011-05-08 NOTE — Progress Notes (Signed)
Utilization review completed.  

## 2011-05-08 NOTE — Progress Notes (Signed)
Speech Language/Pathology Speech Language Pathology Evaluation  Clinical Impression: Pt presents with mild deficits in reading/writing secondary to visual/spatial perception deficits; concrete problem-solving with decreased divergent thinking;  memory/attention at baseline.  Impairments in anticipatory awareness.  Rec acute SLP f/u;  will determine if  HHSLP warranted at time of D/C. SLP Recommendation:  SLP f/u TBA Pt passed RN stroke swallow screen and is tolerating a regular diet with thin liquids.   Blenda Mounts Laurice 05/08/2011, 10:29 AM

## 2011-05-08 NOTE — Progress Notes (Signed)
Stroke Team Progress Note  SUBJECTIVE  Leonard Lindsey is a 77 y.o. male whose stroke event occurred 1 day ago. He has residual symptoms of left sided numbness and uncoordination. Overall he feels his condition is gradually improving. Wife and Daughter at bedside. History of previous stroke 6 years ago details not clear. OBJECTIVE Most recent Vital Signs: Temp: 97.9 F (36.6 C) (11/26 1100) Temp src: Oral (11/26 1100) BP: 129/65 mmHg (11/26 1100) Pulse Rate: 67  (11/26 1100) Respiratory Rate: 18 O2 Saturdation: 93%  CBG (last 3)   Basename 05/08/11 0738  GLUCAP 100*   Intake/Output from previous day: 11/25 0701 - 11/26 0700 In: 300 [P.O.:300] Out: -   IV Fluid Intake:      . sodium chloride 50 mL/hr at 05/08/11 0336   Diet:  Cardiac thin liquids  Activity:  Up with assistance  DVT Prophylaxis:  Lovenox 40 mg sq daily   Studies: CBC    Component Value Date/Time   WBC 7.4 05/07/2011 1900   RBC 4.93 05/07/2011 1900   HGB 15.3 05/07/2011 1900   HCT 44.4 05/07/2011 1900   PLT 213 05/07/2011 1900   MCV 90.1 05/07/2011 1900   MCH 31.0 05/07/2011 1900   MCHC 34.5 05/07/2011 1900   RDW 13.3 05/07/2011 1900   LYMPHSABS 2.6 05/07/2011 1900   MONOABS 0.7 05/07/2011 1900   EOSABS 0.3 05/07/2011 1900   BASOSABS 0.1 05/07/2011 1900   CMP    Component Value Date/Time   NA 136 05/07/2011 1900   K 4.0 05/07/2011 1900   CL 103 05/07/2011 1900   CO2 25 05/07/2011 1900   GLUCOSE 101* 05/07/2011 1900   BUN 28* 05/07/2011 1900   CREATININE 0.96 05/08/2011 0815   CALCIUM 9.3 05/07/2011 1900   PROT 6.6 05/07/2011 1900   ALBUMIN 3.8 05/07/2011 1900   AST 30 05/07/2011 1900   ALT 29 05/07/2011 1900   ALKPHOS 59 05/07/2011 1900   BILITOT 0.3 05/07/2011 1900   GFRNONAA 79* 05/08/2011 0815   GFRAA >90 05/08/2011 0815   COAGS Lab Results  Component Value Date   INR 1.15 05/07/2011   Lipid Panel No results found for this basename: chol,  trig,  hdl,  cholhdl,  vldl,   ldlcalc   HgbA1C  No results found for this basename: HGBA1C   Urine Drug Screen  No results found for this basename: labopia,  cocainscrnur,  labbenz,  amphetmu,  thcu,  labbarb    Alcohol Level No results found for this basename: eth   CT of the brain:  1. Periventricular low attenuation in the right parietal lobe  could represent acute or subacute infarct or chronic ischemic  change but was not present on the prior exam of 2006. Critical  Value/emergent results were called by telephone at the time of  interpretation on 05/07/2011 at 1914 hours to Dr. Adriana Simas, who  verbally acknowledged these results. 2. Atrophy and chronic ischemic white matter disease.   MRI of the brain:  ordered   MRA of the brain:  ordered   2D Echocardiogram:  .ordered    Carotid Doppler:  ordered   CXR:  ordered   EKG:  normal sinus rhythm.     Physical Exam:   Pleasant elderly Caucasian male currently not in distress. Afebrile. Neck is supple without bruit. Hearing is normal. No pedal edema. Cardiac exam no murmur or gallop. Lungs clear to auscultation.  Neurological exam awake alert oriented to time place and person. Speech and language appear  norma.lEye movements are full range without nystagmus. There is mild left lower facial weakness. Tongue is midline. No upper extremity drift but and weakness of the left hip and intrinsic hand muscles. Mild left finger-to-nose dysmetria. Mild weakness of foot tapping on the left. Mild left any sensory loss on the left. Gait is slightly ataxic ASSESSMENT Leonard Lindsey is a 77 y.o. male with likely right pareital  infarct, secondary to ? etiology, on clopidogrel 75 mg orally every day for secondary stroke prevention. Work up underway.  Stroke risk factors:  hypertension and previous stroke  Hospital day # 1  TREATMENT/PLAN Continue plavix 75 mg orally every day for stroke prevention. TEE tomorrow.May need outpatient cardiac telemetry for paroxysmal atrial  fibrillation. Discussed with patient and family.  Joaquin Music, ANP-BC, GNP-BC [facility] Pager: [phone] 05/08/2011 11:59 AM  Dr. Delia Heady, Stroke Center Medical Director, has personally reviewed chart, pertinent data, examined the patient and developed the plan of care.

## 2011-05-08 NOTE — Progress Notes (Addendum)
  05/08/11 12:18 Letha Cape RN, BSN [phone] Spoke with spouse she chose [facility] from agency list. Referral sent to [facility] via [facility] and notified [facility].  Paitent will need pt/ot/st.  Soc will begin 24-48 hrs post discharge.  MEDICARE-CERTIFIED HOME HEALTH AGENCIES [facility]   Agencies that are Medicare-Certified and affiliated with [facility]  Home Health Agency  Telephone Number Address  [facility]  The [facility] has ownership interest in this company; however, you are under no obligation to use this agency. [phone]  [address] [place] [address]    Agencies that are Medicare-Certified and are not affiliated with [facility] Agency Telephone Number Address  Aldine Contes [phone] Fax [phone] [address] [place], [place]  [address]  [facility] [phone] [address] [place], [place] [address]  [facility]  [phone] Fax [phone] [address]  [place], [place]  [address]  [facility] [phone] or [phone] [address] [place], [place] [address]  [facility] [phone] or [phone] [address] [place], [place]  [address]      Agencies that are not Medicare-Certified and are not affiliated with [facility] Agency Telephone Number Address  [facility] [phone] Fax [phone] [address] [place], [place]  [address]  [facility] [phone] or [phone] Fax [phone] [address], [address] [place], [place]  [address]  [facility]  [phone] [address] [place], [place]  [employer] [facility] [phone] Fax [phone] [address] [place], [place]  [address]  [facility] [phone] Fax [phone] [address] [place], [place]   [address]  [facility] [phone] [address] [place], [place]  [address]  [facility] [phone] Fax [phone] [address] [place], [place] [address]  [facility] [phone] [address] [place], [place]  [address]  [facility] [phone] Fax [phone] [address] [place], [place]  [address]

## 2011-05-08 NOTE — Progress Notes (Signed)
*  PRELIMINARY RESULTS*  Carotid Dopplers has been performed.  Right - No significant ICA stenosis. Left - There is a 40 to 59% low end of scale stenosis by velocity. Vertebral arteries are patent with antegrade flow.  Leonard Lindsey 05/08/2011, 9:05 AM

## 2011-05-08 NOTE — Progress Notes (Signed)
  Echocardiogram 2D Echocardiogram has been performed.  Esther Bradstreet Nira Retort 05/08/2011, 3:22 PM

## 2011-05-08 NOTE — Progress Notes (Signed)
Occupational Therapy Evaluation Patient Details Name: Leonard Lindsey MRN: [id number] DOB: 10/20/1932 Today's Date: 05/08/2011 16:06-16:44     evII Problem List:  Patient Active Problem List  Diagnoses  . Hypertension  . Hypothyroid  . Stroke, 64yrs ago  . CVA (cerebral infarction)  . Renal insufficiency  . BPH (benign prostatic hyperplasia)    Past Medical History:  Past Medical History  Diagnosis Date  . Hypertension   . Stroke 77yrs ago  . Hypothyroid    Past Surgical History: History reviewed. No pertinent past surgical history.  OT Assessment/Plan/Recommendation OT Assessment Clinical Impression Statement: Pleasant 77 year old male admitted with new right occipital/parietal CVA resuting in decreased dynamic standing ballance, decreased LUE coordination, and right visual field deficit.  Currently requires supervision/min assist for simulated selfcare and toileting tasks.  Feel pt will benefit from acute OT services to address these deficits and there mpact on his independence with ADL function.  Feel he can reach Modified independent level for d/c home with wife but will continue to need home health OT to increase LUE to [facility] level. OT Recommendation/Assessment: Patient will need skilled OT in the acute care venue OT Problem List: Decreased strength;Decreased coordination;Impaired balance (sitting and/or standing);Impaired UE functional use;Impaired sensation;Impaired vision/perception OT Therapy Diagnosis : Generalized weakness;Disturbance of vision;Hemiplegia non-dominant side OT Plan OT Frequency: Min 2X/week OT Treatment/Interventions: Self-care/ADL training;Therapeutic activities;Neuromuscular education;Therapeutic exercise;DME and/or AE instruction;Patient/family education;Balance training OT Recommendation Follow Up Recommendations: Home health OT Equipment Recommended: None recommended by OT Individuals Consulted Consulted and Agree with Results and Recommendations:  Patient;Family member/caregiver OT Goals Acute Rehab OT Goals OT Goal Formulation: With patient/family ADL Goals Pt Will Perform Lower Body Bathing: with modified independence;Sit to stand from chair ADL Goal: Lower Body Bathing - Progress: Progressing toward goals Pt Will Perform Lower Body Dressing: with modified independence;Sit to stand from chair ADL Goal: Lower Body Dressing - Progress: Progressing toward goals Pt Will Transfer to Toilet: with modified independence;with DME;Ambulation ADL Goal: Toilet Transfer - Progress: Progressing toward goals Pt Will Perform Toileting - Clothing Manipulation: with modified independence;Sitting on 3-in-1 or toilet;Standing ADL Goal: Toileting - Clothing Manipulation - Progress: Progressing toward goals Pt Will Perform Toileting - Hygiene: with modified independence;Sit to stand from 3-in-1/toilet ADL Goal: Toileting - Hygiene - Progress: Progressing toward goals Pt Will Perform Tub/Shower Transfer: Tub transfer;with modified independence;Ambulation;Shower seat with back;with DME ADL Goal: Tub/Shower Transfer - Progress: Progressing toward goals Arm Goals Additional Arm Goal #1: Pt will perform FM/gross motor strengthening exercises with independence following handout. Arm Goal: Additional Goal #1 - Progress: Progressing toward goals Miscellaneous OT Goals Miscellaneous OT Goal #1: Pt will independently compensate with larger head turns for locating objects/environmental barriers on the left side to compensate for visual field deficit. OT Goal: Miscellaneous Goal #1 - Progress: Progressing toward goals  OT Evaluation Precautions/Restrictions  Precautions Precautions: Fall Required Braces or Orthoses: No Restrictions Weight Bearing Restrictions: No Prior Functioning Home Living Lives With: Spouse Receives Help From: Family (Wife) Type of Home: House Home Layout: One level Home Access: Stairs to enter Entrance Stairs-Rails: Can reach  both Entrance Stairs-Number of Steps: 2 Bathroom Shower/Tub: Forensic scientist: Standard Bathroom Accessibility: Yes Home Adaptive Equipment: Straight cane;Wheelchair - manual Prior Function Level of Independence: Independent with homemaking with ambulation Vocation: Retired ADL ADL Eating/Feeding: Simulated;Supervision/safety Where Assessed - Eating/Feeding: Edge of bed Grooming: Performed;Brushing hair;Supervision/safety Upper Body Bathing: Simulated;Supervision/safety Where Assessed - Upper Body Bathing: Unsupported;Sitting, bed Lower Body Bathing: Simulated;Supervision/safety Where Assessed - Lower Body  Bathing: Sit to stand from bed Upper Body Dressing: Simulated;Supervision/safety Where Assessed - Upper Body Dressing: Supported;Sitting, bed Lower Body Dressing: Simulated;Supervision/safety Where Assessed - Lower Body Dressing: Sit to stand from bed Toilet Transfer: Minimal assistance Toilet Transfer Method: Ambulating Toilet Transfer Equipment: Comfort height toilet;Grab bars Toileting - Clothing Manipulation: Simulated;Minimal assistance Where Assessed - Toileting Clothing Manipulation: Sit to stand from 3-in-1 or toilet Toileting - Hygiene: Simulated;Minimal assistance Where Assessed - Toileting Hygiene: Sit to stand from 3-in-1 or toilet Tub/Shower Transfer: Not assessed ADL Comments: Pt currently uses the LUE as a active assist with increased time for grooming and simulated selfcare tasks.  Vision/Perception  Vision - History Baseline Vision: Wears glasses only for reading Patient Visual Report: Peripheral vision impairment;Other (comment) (Pt with left visual field cut present.) Vision - Assessment Eye Alignment: Within Functional Limits Vision Assessment: Vision tested Visual Fields: Left homonymous hemianopsia Additional Comments: Occular ROM WFLs for both eyes. Perception Perception: Within Functional Limits Praxis Praxis:  Intact Cognition Cognition Arousal/Alertness: Awake/alert Overall Cognitive Status: Appears within functional limits for tasks assessed Orientation Level: Oriented X4 Awareness of Deficits: Fully aware of deficits Awareness of Deficits - Other Comments: Pt able to state awareness of left visual field deficit. Sensation/Coordination Sensation Light Touch: Appears Intact Proprioception: Impaired Detail Proprioception Impaired Details: Impaired LUE Additional Comments: Pt with impaired proprioception from left elbow distally. Coordination Gross Motor Movements are Fluid and Coordinated: No Fine Motor Movements are Fluid and Coordinated: No Coordination and Movement Description: Pt able to oppose thumb to digits 2-3 of left hand.  Unable to oppose thumb to 4th and 5th digit.   Finger Nose Finger Test: Decreased speed and accurracy on the left side compared to the right side. Extremity Assessment RUE Assessment RUE Assessment: Exceptions to [facility] RUE AROM (degrees) RUE Overall AROM Comments: RUE AROM WFLs except for internal rotation when attempting to reach behind his back.  Exhibits approximately 75 % of normal. RUE Strength RUE Overall Strength Comments: Strength overall 4/5 throught LUE AROM (degrees) Left Shoulder Flexion  0-170: 110 Degrees LUE Strength LUE Overall Strength Comments: Pt exhibits overall strength of 4/5 throughout.  Tends to compensate with trunk by leaning to the right when attempting shoulder flexion greater than 90 degrees.  Decreased FM coordination noted as well. Mobility  Transfers Transfers: Yes Sit to Stand: 5: Supervision;From bed;With upper extremity assist Exercises   End of Session OT - End of Session Equipment Utilized During Treatment: Gait belt Activity Tolerance: Patient tolerated treatment well Patient left: in bed;with call bell in reach;with family/visitor present General Behavior During Session: [facility] for tasks performed Cognition: [facility] for  tasks performed   Mattisen Pohlmann,Damiel OTR/L 05/08/2011, 5:20 PM  Pager number [phone]

## 2011-05-08 NOTE — Progress Notes (Signed)
PATIENT DETAILS Name: Leonard Lindsey Age: 77 y.o. Sex: male Date of Birth: 10/08/1932 Admit Date: 05/07/2011 PCP:MCINNIS,ANGUS G, MD  Subjective: Still having some left-sided numbness. But otherwise no major complaints.  Objective: Vital signs in last 24 hours: Filed Vitals:   05/08/11 0130 05/08/11 0630 05/08/11 0900 05/08/11 1100  BP: 144/79 134/72 142/71 129/65  Pulse: 62 68 71 67  Temp: 97.9 F (36.6 C) 98.4 F (36.9 C) 98.6 F (37 C) 97.9 F (36.6 C)  TempSrc: Oral Oral Oral Oral  Resp: 20 18 20 18  Height: 5' 9.5" (1.765 m)     Weight: 101.9 kg (224 lb 10.4 oz)     SpO2: 96% 92% 92% 93%    Weight change:   Body mass index is 32.70 kg/(m^2).  Intake/Output from previous day:  Intake/Output Summary (Last 24 hours) at 05/08/11 1453 Last data filed at 05/08/11 1400  Gross per 24 hour  Intake    540 ml  Output    850 ml  Net   -310 ml    PHYSICAL EXAM: Gen Exam: Awake and alert with clear speech.   Neck: Supple, No JVD.   Chest: B/L Clear.   CVS: S1 S2 Regular, no murmurs.  Abdomen: soft, BS +, non tender, non distended.  Extremities: no edema, lower extremities warm to touch. Neurologic: Mild left-sided weakness. Skin: No Rash.   Wounds: N/A.    CONSULTS:  neurology-stroke team.  LAB RESULTS: CBC  Lab 05/07/11 1900  WBC 7.4  HGB 15.3  HCT 44.4  PLT 213  MCV 90.1  MCH 31.0  MCHC 34.5  RDW 13.3  LYMPHSABS 2.6  MONOABS 0.7  EOSABS 0.3  BASOSABS 0.1  BANDABS --    Chemistries   Lab 05/08/11 0815 05/07/11 1900  NA -- 136  K -- 4.0  CL -- 103  CO2 -- 25  GLUCOSE -- 101*  BUN -- 28*  CREATININE 0.96 1.37*  CALCIUM -- 9.3  MG -- --    GFR Estimated Creatinine Clearance: 77.7 ml/min (by C-G formula based on Cr of 0.96).  Coagulation profile  Lab 05/07/11 1900  INR 1.15  PROTIME --    Cardiac Enzymes No results found for this basename: CK:3,CKMB:3,TROPONINI:3,MYOGLOBIN:3 in the last 168 hours  No results found for this  basename: POCBNP:3 in the last 168 hours No results found for this basename: DDIMER:2 in the last 72 hours No results found for this basename: HGBA1C:2 in the last 72 hours No results found for this basename: CHOL:2,HDL:2,LDLCALC:2,TRIG:2,CHOLHDL:2,LDLDIRECT:2 in the last 72 hours No results found for this basename: TSH,T4TOTAL,FREET3,T3FREE,THYROIDAB in the last 72 hours No results found for this basename: VITAMINB12:2,FOLATE:2,FERRITIN:2,TIBC:2,IRON:2,RETICCTPCT:2 in the last 72 hours No results found for this basename: LIPASE:2,AMYLASE:2 in the last 72 hours  Urine Studies No results found for this basename: UACOL:2,UAPR:2,USPG:2,UPH:2,UTP:2,UGL:2,UKET:2,UBIL:2,UHGB:2,UNIT:2,UROB:2,ULEU:2,UEPI:2,UWBC:2,URBC:2,UBAC:2,CAST:2,CRYS:2,UCOM:2,BILUA:2 in the last 72 hours  MICROBIOLOGY: No results found for this or any previous visit (from the past 240 hour(s)).  RADIOLOGY STUDIES/RESULTS: Dg Chest 2 View  05/08/2011  *RADIOLOGY REPORT*  Clinical Data: Stroke, hypertension.  CHEST - 2 VIEW  Comparison: 11/16/2009  Findings: Heart and mediastinal contours are within normal limits. No focal opacities or effusions.  No acute bony abnormality. Degenerative changes in the thoracic spine.  IMPRESSION: No active cardiopulmonary disease.  Original Report Authenticated By: KEVIN G. DOVER, M.D.   Ct Head Wo Contrast  05/07/2011  *RADIOLOGY REPORT*  Clinical Data: Code stroke.  Left arm numbness.  CT HEAD WITHOUT CONTRAST  Technique:  Contiguous   axial images were obtained from the base of the skull through the vertex without contrast.  Comparison: 08/31/2004.  Findings: Atrophy and chronic ischemic white matter disease is present.  There is low attenuation posterior to the body and atrium of the right lateral ventricle in the right parietal lobe, suspicious for infarction.  Chronic ischemic white matter disease could mimic this appearance.  No hemorrhage is identified.  Mastoid air cells appear clear.   Paranasal sinuses clear.  No mass effect or midline shift. Scattered small lacunar infarcts are present in the sub insular regions.  IMPRESSION: 1.  Periventricular low attenuation in the right parietal lobe could represent acute or subacute infarct or chronic ischemic change but was not present on the prior exam of 2006. Critical Value/emergent results were called by telephone at the time of interpretation on 05/07/2011  at 1914 hours  to  Dr. Cook, who verbally acknowledged these results. 2.  Atrophy and chronic ischemic white matter disease.  Original Report Authenticated By: GEOFFREY LAMKE, M.D.   Mr Brain Wo Contrast  05/08/2011  *RADIOLOGY REPORT*  Clinical Data:  Stroke.  Left arm numbness  MRI HEAD WITHOUT CONTRAST MRA HEAD WITHOUT CONTRAST  Technique:  Multiplanar, multiecho pulse sequences of the brain and surrounding structures were obtained without intravenous contrast. Angiographic images of the head were obtained using MRA technique without contrast.  Comparison:  CT 05/07/2011  MRI HEAD  Findings:  Scattered areas of acute infarction in the right occipital parietal lobe involving gray matter and white matter. Multiple small areas of acute infarction are present in the right posterior cerebral artery territory.  Generalized atrophy.  Chronic ischemia is present in the white matter bilaterally.  Small chronic infarct right cerebellum. Brainstem is intact.  Negative for hemorrhage or mass.  IMPRESSION: Atrophy and chronic microvascular ischemia.  Multiple small areas of acute infarction in the right occipital parietal lobe.  MRA HEAD  Findings: Both vertebral arteries are patent to the basilar.  Left vertebral is dominant.  Posterior cerebral arteries are patent bilaterally without significant stenosis.  The patient has an acute right posterior cerebral artery infarct however the right posterior cerebral artery is widely patent suggesting possible emboli.  Internal carotid artery is widely patent  bilaterally.  Anterior and middle cerebral arteries are patent bilaterally without stenosis. Single combined A2 segment is present which is a normal variant. Negative for aneurysm.  IMPRESSION: Negative intracranial MRA.  Original Report Authenticated By: DAVID C. CLARK, M.D.   Mr Mra Head/brain Wo Cm  05/08/2011  *RADIOLOGY REPORT*  Clinical Data:  Stroke.  Left arm numbness  MRI HEAD WITHOUT CONTRAST MRA HEAD WITHOUT CONTRAST  Technique:  Multiplanar, multiecho pulse sequences of the brain and surrounding structures were obtained without intravenous contrast. Angiographic images of the head were obtained using MRA technique without contrast.  Comparison:  CT 05/07/2011  MRI HEAD  Findings:  Scattered areas of acute infarction in the right occipital parietal lobe involving gray matter and white matter. Multiple small areas of acute infarction are present in the right posterior cerebral artery territory.  Generalized atrophy.  Chronic ischemia is present in the white matter bilaterally.  Small chronic infarct right cerebellum. Brainstem is intact.  Negative for hemorrhage or mass.  IMPRESSION: Atrophy and chronic microvascular ischemia.  Multiple small areas of acute infarction in the right occipital parietal lobe.  MRA HEAD  Findings: Both vertebral arteries are patent to the basilar.  Left vertebral is dominant.  Posterior cerebral arteries are patent bilaterally without   significant stenosis.  The patient has an acute right posterior cerebral artery infarct however the right posterior cerebral artery is widely patent suggesting possible emboli.  Internal carotid artery is widely patent bilaterally.  Anterior and middle cerebral arteries are patent bilaterally without stenosis. Single combined A2 segment is present which is a normal variant. Negative for aneurysm.  IMPRESSION: Negative intracranial MRA.  Original Report Authenticated By: DAVID C. CLARK, M.D.    MEDICATIONS: Scheduled Meds:   . clopidogrel   75 mg Oral Q breakfast  . cyanocobalamin  1,000 mcg Intramuscular Q30 days  . diltiazem  180 mg Oral QAM  . dutasteride  0.5 mg Oral QHS  . enoxaparin  40 mg Subcutaneous Q24H  . levothyroxine  137 mcg Oral QAM  . rosuvastatin  20 mg Oral Daily  . Tamsulosin HCl  0.4 mg Oral QHS  . DISCONTD: ramipril  1.25 mg Oral QAM   Continuous Infusions:   . sodium chloride 50 mL/hr at 05/08/11 0336   PRN Meds:.labetalol, ondansetron (ZOFRAN) IV, senna-docusate  Antibiotics: Anti-infectives    None      Assessment/Plan: Patient Active Hospital Problem List: CVA (cerebral infarction)    Assessment: Likely embolic    Plan: Continue with Plavix and Crestor, TEE in a.m. Appreciate neurology input. EKG shows sinus rhythm. Telemetry strips  also show sinus.  Hypertension    Assessment: Stable    Plan: Continue with Cardizem. Allow for some permissive hypertension.   Hypothyroid    Assessment: Stable    Plan: Continue with Synthroid.   Renal insufficiency    Assessment: Now resolved.    Plan: Monitor periodically.   BPH (benign prostatic hyperplasia)    Assessment: Stable    Plan:  continue with Flomax and Avodart.   Disposition: Remain inpatient workup complete  DVT Prophylaxis:  sub-cutaneous Lovenox.  Code Status: Full code  SHANKER M GHIMIRE, MD. 05/08/2011, 2:53 PM    

## 2011-05-08 NOTE — Progress Notes (Signed)
Physical Therapy Evaluation Patient Details Name: Leonard Lindsey MRN: [id number] DOB: 20-Nov-1932 Today's Date: 05/08/2011  Problem List:  Patient Active Problem List  Diagnoses  . Hypertension  . Hypothyroid  . Stroke, 75yrs ago  . CVA (cerebral infarction)  . Renal insufficiency    Past Medical History:  Past Medical History  Diagnosis Date  . Hypertension   . Stroke 32yrs ago  . Hypothyroid    Past Surgical History: History reviewed. No pertinent past surgical history.  PT Assessment/Plan/Recommendation PT Assessment Clinical Impression Statement: Patient presented to ED with gait disturbance and left arm weakness. NIHSS 1/2. Patient with visual deficits on examination today and with reading brochure misses first two words on each sentence. Patient will benefit from PT to maximize independence. PT Recommendation/Assessment: Patient will need skilled PT in the acute care venue PT Problem List: Decreased activity tolerance;Decreased balance;Decreased knowledge of precautions;Decreased safety awareness Problem List Comments: Visual deficits Barriers to Discharge: Decreased caregiver support PT Therapy Diagnosis : Difficulty walking PT Plan PT Frequency: Min 4X/week PT Treatment/Interventions: DME instruction;Gait training;Stair training;Functional mobility training;Balance training;Patient/family education;Cognitive remediation PT Recommendation Follow Up Recommendations: Home health PT Equipment Recommended: None recommended by PT PT Goals  Acute Rehab PT Goals PT Goal Formulation: With patient Pt will go Supine/Side to Sit: with modified independence PT Goal: Supine/Side to Sit - Progress: Other (comment) Pt will go Sit to Supine/Side: with modified independence PT Goal: Sit to Supine/Side - Progress: Other (comment) Pt will Stand: with modified independence;1 - 2 min;with no upper extremity support (for functional task) PT Goal: Stand - Progress: Other (comment) Pt will  Ambulate: >150 feet;with modified independence;with cane PT Goal: Ambulate - Progress: Other (comment) Pt will Go Up / Down Stairs: 1-2 stairs;with modified independence;with cane PT Goal: Up/Down Stairs - Progress: Other (comment)  PT Evaluation Precautions/Restrictions  Precautions Precautions: Fall Prior Functioning  Home Living Lives With: Spouse Type of Home: House Home Layout: One level Home Access: Stairs to enter Entrance Stairs-Rails: Can reach both Entrance Stairs-Number of Steps: 2 Home Adaptive Equipment: Straight cane;Wheelchair - manual Prior Function Level of Independence: Independent with homemaking with ambulation Vocation: Retired Comments: Enjoys working at [employer] with food drives etc. Cognition Cognition Arousal/Alertness: Awake/alert Overall Cognitive Status: Impaired Orientation Level: Oriented X4 Awareness of Deficits: Decreased awareness of deficits Awareness of Deficits - Other Comments: Decreased awareness of visual deficits Sensation/Coordination Sensation Light Touch: Appears Intact (lower extremities) Proprioception: Appears Intact (lower extremities) Additional Comments: Patient with left visual field cut Coordination Gross Motor Movements are Fluid and Coordinated: Yes (lower extremities) Extremity Assessment RLE Assessment RLE Assessment: Within Functional Limits LLE Assessment LLE Assessment: Within Functional Limits Mobility (including Balance) Transfers Transfers: Yes Sit to Stand: 5: Supervision;From chair/3-in-1;With upper extremity assist Sit to Stand Details (indicate cue type and reason): Increased time secondary to chronic back issues Stand to Sit: 5: Supervision;With upper extremity assist;To chair/3-in-1 Stand to Sit Details: Patient with difficulty locating arm rest on left side Ambulation/Gait Ambulation/Gait: Yes Ambulation/Gait Assistance: 5: Supervision Ambulation/Gait Assistance Details (indicate cue type and  reason): Patient with decreased stride length, decreased speed, and flexed posture Ambulation Distance (Feet): 120 Feet Assistive device: Straight cane Stairs: Yes Stairs Assistance: 5: Supervision Stairs Assistance Details (indicate cue type and reason): step to pattern Stair Management Technique: Two rails Number of Stairs: 2  Height of Stairs: 8   Posture/Postural Control Posture/Postural Control: Postural limitations Postural Limitations: Flexed posture is chronic - patient states from arthritis in back and improves with increased  time  End of Session PT - End of Session Equipment Utilized During Treatment: Gait belt Activity Tolerance: Patient tolerated treatment well Patient left: in chair General Behavior During Session: [facility] for tasks performed  Edwyna Perfect, PT  Pager [phone]  05/08/2011, 8:21 AM

## 2011-05-08 NOTE — ED Notes (Signed)
MD at bedside. Hospitalist with patient

## 2011-05-09 ENCOUNTER — Encounter (HOSPITAL_COMMUNITY)
Admission: EM | Disposition: A | Discharge status: Home Health | Payer: Self-pay | Source: Home / Self Care | Attending: Internal Medicine

## 2011-05-09 ENCOUNTER — Encounter (HOSPITAL_COMMUNITY): Payer: Self-pay | Admitting: *Deleted

## 2011-05-09 DIAGNOSIS — I6789 Other cerebrovascular disease: Secondary | ICD-10-CM

## 2011-05-09 DIAGNOSIS — I999 Unspecified disorder of circulatory system: Secondary | ICD-10-CM

## 2011-05-09 HISTORY — PX: TEE WITHOUT CARDIOVERSION: SHX5443

## 2011-05-09 LAB — URINALYSIS, ROUTINE W REFLEX MICROSCOPIC
Hgb urine dipstick: NEGATIVE
Leukocytes, UA: NEGATIVE
Nitrite: NEGATIVE
Specific Gravity, Urine: 1.02 (ref 1.005–1.030)
Urobilinogen, UA: 1 mg/dL (ref 0.0–1.0)

## 2011-05-09 LAB — LIPID PANEL: Cholesterol: 144 mg/dL (ref 0–200)

## 2011-05-09 SURGERY — ECHOCARDIOGRAM, TRANSESOPHAGEAL
Anesthesia: Moderate Sedation

## 2011-05-09 MED ORDER — BUTAMBEN-TETRACAINE-BENZOCAINE 2-2-14 % EX AERO
INHALATION_SPRAY | CUTANEOUS | Status: DC | PRN
  Administered 2011-05-09 (×2): 1 via TOPICAL

## 2011-05-09 MED ORDER — FENTANYL CITRATE 0.05 MG/ML IJ SOLN
INTRAMUSCULAR | Status: DC | PRN
  Administered 2011-05-09: 25 ug via INTRAVENOUS

## 2011-05-09 MED ORDER — FENTANYL CITRATE 0.05 MG/ML IJ SOLN
INTRAMUSCULAR | Status: AC
  Filled 2011-05-09: qty 2

## 2011-05-09 MED ORDER — MIDAZOLAM HCL 10 MG/2ML IJ SOLN
INTRAMUSCULAR | Status: AC
  Filled 2011-05-09: qty 2

## 2011-05-09 MED ORDER — MIDAZOLAM HCL 10 MG/2ML IJ SOLN
INTRAMUSCULAR | Status: DC | PRN
  Administered 2011-05-09: 2 mg via INTRAVENOUS

## 2011-05-09 NOTE — Procedures (Signed)
See echo report; atrial septal aneurysm and positive bubble study. Leonard Lindsey

## 2011-05-09 NOTE — Op Note (Signed)
See echo system report Olga Millers

## 2011-05-09 NOTE — Progress Notes (Signed)
Stroke Team Progress Note  SUBJECTIVE  Leonard Lindsey is a 77 y.o. male whose stroke event occurred 2 days ago. He has residual symptoms of left sided numbnes and uncoordination. Overall he feels his condition is gradually worsening. Wife at bedside. Patient just back from TEE.  OBJECTIVE Most recent Vital Signs: Temp: 98.7 F (37.1 C) (11/27 0955) Temp src: Oral (11/27 0955) BP: 136/76 mmHg (11/27 1136) Pulse Rate: 61  (11/27 0500) Respiratory Rate: 22 O2 Saturdation: 94%  CBG (last 3)   Basename 05/09/11 0650 05/08/11 0738  GLUCAP 106* 100*   Intake/Output from previous day: 11/26 0701 - 11/27 0700 In: 1440 [P.O.:840; I.V.:600] Out: 1150 [Urine:1150]  IV Fluid Intake:      . sodium chloride 500 mL (05/09/11 1007)   Diet:  Cardiac thin liquids  Activity:  Up with assistance  DVT Prophylaxis:  Lovenox 40 mg sq daily   Studies: CBC    Component Value Date/Time   WBC 7.4 05/07/2011 1900   RBC 4.93 05/07/2011 1900   HGB 15.3 05/07/2011 1900   HCT 44.4 05/07/2011 1900   PLT 213 05/07/2011 1900   MCV 90.1 05/07/2011 1900   MCH 31.0 05/07/2011 1900   MCHC 34.5 05/07/2011 1900   RDW 13.3 05/07/2011 1900   LYMPHSABS 2.6 05/07/2011 1900   MONOABS 0.7 05/07/2011 1900   EOSABS 0.3 05/07/2011 1900   BASOSABS 0.1 05/07/2011 1900   CMP    Component Value Date/Time   NA 136 05/07/2011 1900   K 4.0 05/07/2011 1900   CL 103 05/07/2011 1900   CO2 25 05/07/2011 1900   GLUCOSE 101* 05/07/2011 1900   BUN 28* 05/07/2011 1900   CREATININE 0.96 05/08/2011 0815   CALCIUM 9.3 05/07/2011 1900   PROT 6.6 05/07/2011 1900   ALBUMIN 3.8 05/07/2011 1900   AST 30 05/07/2011 1900   ALT 29 05/07/2011 1900   ALKPHOS 59 05/07/2011 1900   BILITOT 0.3 05/07/2011 1900   GFRNONAA 79* 05/08/2011 0815   GFRAA >90 05/08/2011 0815   COAGS Lab Results  Component Value Date   INR 1.15 05/07/2011   Lipid Panel    Component Value Date/Time   CHOL 144 05/09/2011 0655   HgbA1C  Lab  Results  Component Value Date   HGBA1C 5.9* 05/08/2011   Urine Drug Screen  No results found for this basename: labopia,  cocainscrnur,  labbenz,  amphetmu,  thcu,  labbarb    Alcohol Level No results found for this basename: eth    MRI of the brain:  Atrophy and chronic microvascular ischemia.  Multiple small areas of acute infarction in the right occipital parietal lobe.  MRA of the brain:   Negative intracranial MRA.  2D Echocardiogram:  EF 55-60% with no source of embolus.   TEE:  PFO  Carotid Doppler:  Right - No significant ICA stenosis. Left - There is a 40 to 59% low end of scale stenosis by velocity. Vertebral arteries are patent with antegrade flow.  CXR:  No active cardiopulmonary disease.  Physical Exam:  Pleasant elderly Caucasian male currently not in distress. Afebrile. Neck is supple without bruit. Hearing is normal. No pedal edema. Cardiac exam no murmur or gallop. Lungs clear to auscultation. Neurological exam awake alert oriented to time place and person. Speech and language appear norma.lEye movements are full range without nystagmus. There is mild left lower facial weakness. Tongue is midline. No upper extremity drift but and weakness of the left hip and intrinsic hand muscles. Mild  left finger-to-nose dysmetria. Mild weakness of foot tapping on the left. Mild left any sensory loss on the left. Gait is slightly ataxic  ASSESSMENT [name] is a 77 y.o. male with acute infarcts in the right occipital parietal lobe, secondary to unknown embolic source, on clopidogrel 75 mg orally every day for secondary stroke prevention.  Newly found PFO. Likely an incidental finding. Will check LE dopplers for DVT to see if PFO could be related.  Urinary incontinence and dysuria.  Stroke risk factors:  hypertension and previous stroke  Hospital day # 2  TREATMENT/PLAN Continue clopidogrel 75 mg orally every day for stroke prevention. UA and culture LE venous  dopplers. If negative, ok for discharge. Ok for discharge once dopplers completed.  Joaquin Music, ANP-BC, GNP-BC [facility] Pager: [phone] 05/09/2011 12:31 PM  Dr. Delia Heady, Stroke Center Medical Director, has personally reviewed chart, pertinent data, examined the patient and developed the plan of care.

## 2011-05-09 NOTE — Interval H&P Note (Deleted)
History and Physical Interval Note:   05/09/2011   10:46 AM   Leonard Lindsey  has presented today for surgery, with the diagnosis of Stroke  The various methods of treatment have been discussed with the patient and family. After consideration of risks, benefits and other options for treatment, the patient has consented to  Procedure(s): TRANSESOPHAGEAL ECHOCARDIOGRAM (TEE) as a surgical intervention .  The patients' history has been reviewed, patient examined, no change in status, stable for surgery.  I have reviewed the patients' chart and labs.  Questions were answered to the patient's satisfaction.     Olga Millers  MD Reviewed; no changes Olga Millers

## 2011-05-09 NOTE — H&P (View-Only) (Deleted)
PATIENT DETAILS Name: Leonard Lindsey Age: 77 y.o. Sex: male Date of Birth: August 13, 1932 Admit Date: 05/07/2011 ZOX:[id number] G, MD  Subjective: Still having some left-sided numbness. But otherwise no major complaints.  Objective: Vital signs in last 24 hours: Filed Vitals:   05/08/11 0130 05/08/11 0630 05/08/11 0900 05/08/11 1100  BP: 144/79 134/72 142/71 129/65  Pulse: 62 68 71 67  Temp: 97.9 F (36.6 C) 98.4 F (36.9 C) 98.6 F (37 C) 97.9 F (36.6 C)  TempSrc: Oral Oral Oral Oral  Resp: 20 18 20 18   Height: 5' 9.5" (1.765 m)     Weight: 101.9 kg (224 lb 10.4 oz)     SpO2: 96% 92% 92% 93%    Weight change:   Body mass index is 32.70 kg/(m^2).  Intake/Output from previous day:  Intake/Output Summary (Last 24 hours) at 05/08/11 1453 Last data filed at 05/08/11 1400  Gross per 24 hour  Intake    540 ml  Output    850 ml  Net   -310 ml    PHYSICAL EXAM: Gen Exam: Awake and alert with clear speech.   Neck: Supple, No JVD.   Chest: B/L Clear.   CVS: S1 S2 Regular, no murmurs.  Abdomen: soft, BS +, non tender, non distended.  Extremities: no edema, lower extremities warm to touch. Neurologic: Mild left-sided weakness. Skin: No Rash.   Wounds: N/A.    CONSULTS:  neurology-stroke team.  LAB RESULTS: CBC  Lab 05/07/11 1900  WBC 7.4  HGB 15.3  HCT 44.4  PLT 213  MCV 90.1  MCH 31.0  MCHC 34.5  RDW 13.3  LYMPHSABS 2.6  MONOABS 0.7  EOSABS 0.3  BASOSABS 0.1  BANDABS --    Chemistries   Lab 05/08/11 0815 05/07/11 1900  NA -- 136  K -- 4.0  CL -- 103  CO2 -- 25  GLUCOSE -- 101*  BUN -- 28*  CREATININE 0.96 1.37*  CALCIUM -- 9.3  MG -- --    GFR Estimated Creatinine Clearance: 77.7 ml/min (by C-G formula based on Cr of 0.96).  Coagulation profile  Lab 05/07/11 1900  INR 1.15  PROTIME --    Cardiac Enzymes No results found for this basename: CK:3,CKMB:3,TROPONINI:3,MYOGLOBIN:3 in the last 168 hours  No results found for this  basename: POCBNP:3 in the last 168 hours No results found for this basename: DDIMER:2 in the last 72 hours No results found for this basename: HGBA1C:2 in the last 72 hours No results found for this basename: CHOL:2,HDL:2,LDLCALC:2,TRIG:2,CHOLHDL:2,LDLDIRECT:2 in the last 72 hours No results found for this basename: TSH,T4TOTAL,FREET3,T3FREE,THYROIDAB in the last 72 hours No results found for this basename: VITAMINB12:2,FOLATE:2,FERRITIN:2,TIBC:2,IRON:2,RETICCTPCT:2 in the last 72 hours No results found for this basename: LIPASE:2,AMYLASE:2 in the last 72 hours  Urine Studies No results found for this basename: UACOL:2,UAPR:2,USPG:2,UPH:2,UTP:2,UGL:2,UKET:2,UBIL:2,UHGB:2,UNIT:2,UROB:2,ULEU:2,UEPI:2,UWBC:2,URBC:2,UBAC:2,CAST:2,CRYS:2,UCOM:2,BILUA:2 in the last 72 hours  MICROBIOLOGY: No results found for this or any previous visit (from the past 240 hour(s)).  RADIOLOGY STUDIES/RESULTS: Dg Chest 2 View  05/08/2011  *RADIOLOGY REPORT*  Clinical Data: Stroke, hypertension.  CHEST - 2 VIEW  Comparison: 11/16/2009  Findings: Heart and mediastinal contours are within normal limits. No focal opacities or effusions.  No acute bony abnormality. Degenerative changes in the thoracic spine.  IMPRESSION: No active cardiopulmonary disease.  Original Report Authenticated By: Cyndie Chime, M.D.   Ct Head Wo Contrast  05/07/2011  *RADIOLOGY REPORT*  Clinical Data: Code stroke.  Left arm numbness.  CT HEAD WITHOUT CONTRAST  Technique:  Contiguous  axial images were obtained from the base of the skull through the vertex without contrast.  Comparison: 08/31/2004.  Findings: Atrophy and chronic ischemic white matter disease is present.  There is low attenuation posterior to the body and atrium of the right lateral ventricle in the right parietal lobe, suspicious for infarction.  Chronic ischemic white matter disease could mimic this appearance.  No hemorrhage is identified.  Mastoid air cells appear clear.   Paranasal sinuses clear.  No mass effect or midline shift. Scattered small lacunar infarcts are present in the sub insular regions.  IMPRESSION: 1.  Periventricular low attenuation in the right parietal lobe could represent acute or subacute infarct or chronic ischemic change but was not present on the prior exam of 2006. Critical Value/emergent results were called by telephone at the time of interpretation on 05/07/2011  at 1914 hours  to  Dr. Adriana Simas, who verbally acknowledged these results. 2.  Atrophy and chronic ischemic white matter disease.  Original Report Authenticated By: Andreas Newport, M.D.   Mr Brain Wo Contrast  05/08/2011  *RADIOLOGY REPORT*  Clinical Data:  Stroke.  Left arm numbness  MRI HEAD WITHOUT CONTRAST MRA HEAD WITHOUT CONTRAST  Technique:  Multiplanar, multiecho pulse sequences of the brain and surrounding structures were obtained without intravenous contrast. Angiographic images of the head were obtained using MRA technique without contrast.  Comparison:  CT 05/07/2011  MRI HEAD  Findings:  Scattered areas of acute infarction in the right occipital parietal lobe involving gray matter and white matter. Multiple small areas of acute infarction are present in the right posterior cerebral artery territory.  Generalized atrophy.  Chronic ischemia is present in the white matter bilaterally.  Small chronic infarct right cerebellum. Brainstem is intact.  Negative for hemorrhage or mass.  IMPRESSION: Atrophy and chronic microvascular ischemia.  Multiple small areas of acute infarction in the right occipital parietal lobe.  MRA HEAD  Findings: Both vertebral arteries are patent to the basilar.  Left vertebral is dominant.  Posterior cerebral arteries are patent bilaterally without significant stenosis.  The patient has an acute right posterior cerebral artery infarct however the right posterior cerebral artery is widely patent suggesting possible emboli.  Internal carotid artery is widely patent  bilaterally.  Anterior and middle cerebral arteries are patent bilaterally without stenosis. Single combined A2 segment is present which is a normal variant. Negative for aneurysm.  IMPRESSION: Negative intracranial MRA.  Original Report Authenticated By: Camelia Phenes, M.D.   Mr Mra Head/brain Wo Cm  05/08/2011  *RADIOLOGY REPORT*  Clinical Data:  Stroke.  Left arm numbness  MRI HEAD WITHOUT CONTRAST MRA HEAD WITHOUT CONTRAST  Technique:  Multiplanar, multiecho pulse sequences of the brain and surrounding structures were obtained without intravenous contrast. Angiographic images of the head were obtained using MRA technique without contrast.  Comparison:  CT 05/07/2011  MRI HEAD  Findings:  Scattered areas of acute infarction in the right occipital parietal lobe involving gray matter and white matter. Multiple small areas of acute infarction are present in the right posterior cerebral artery territory.  Generalized atrophy.  Chronic ischemia is present in the white matter bilaterally.  Small chronic infarct right cerebellum. Brainstem is intact.  Negative for hemorrhage or mass.  IMPRESSION: Atrophy and chronic microvascular ischemia.  Multiple small areas of acute infarction in the right occipital parietal lobe.  MRA HEAD  Findings: Both vertebral arteries are patent to the basilar.  Left vertebral is dominant.  Posterior cerebral arteries are patent bilaterally without  significant stenosis.  The patient has an acute right posterior cerebral artery infarct however the right posterior cerebral artery is widely patent suggesting possible emboli.  Internal carotid artery is widely patent bilaterally.  Anterior and middle cerebral arteries are patent bilaterally without stenosis. Single combined A2 segment is present which is a normal variant. Negative for aneurysm.  IMPRESSION: Negative intracranial MRA.  Original Report Authenticated By: Camelia Phenes, M.D.    MEDICATIONS: Scheduled Meds:   . clopidogrel   75 mg Oral Q breakfast  . cyanocobalamin  1,000 mcg Intramuscular Q30 days  . diltiazem  180 mg Oral QAM  . dutasteride  0.5 mg Oral QHS  . enoxaparin  40 mg Subcutaneous Q24H  . levothyroxine  137 mcg Oral QAM  . rosuvastatin  20 mg Oral Daily  . Tamsulosin HCl  0.4 mg Oral QHS  . DISCONTD: ramipril  1.25 mg Oral QAM   Continuous Infusions:   . sodium chloride 50 mL/hr at 05/08/11 0336   PRN Meds:.labetalol, ondansetron (ZOFRAN) IV, senna-docusate  Antibiotics: Anti-infectives    None      Assessment/Plan: Patient Active Hospital Problem List: CVA (cerebral infarction)    Assessment: Likely embolic    Plan: Continue with Plavix and Crestor, TEE in a.m. Appreciate neurology input. EKG shows sinus rhythm. Telemetry strips  also show sinus.  Hypertension    Assessment: Stable    Plan: Continue with Cardizem. Allow for some permissive hypertension.   Hypothyroid    Assessment: Stable    Plan: Continue with Synthroid.   Renal insufficiency    Assessment: Now resolved.    Plan: Monitor periodically.   BPH (benign prostatic hyperplasia)    Assessment: Stable    Plan:  continue with Flomax and Avodart.   Disposition: Remain inpatient workup complete  DVT Prophylaxis:  sub-cutaneous Lovenox.  Code Status: Full code  Maretta Bees, MD. 05/08/2011, 2:53 PM

## 2011-05-09 NOTE — Progress Notes (Signed)
*  PRELIMINARY RESULTS*  Bilateral Lower Extremity Venous Duplex has been performed. No obvious DVT seen in either leg.  Farrel Demark 05/09/2011, 3:41 PM

## 2011-05-09 NOTE — Clinical Documentation Improvement (Signed)
GENERIC DOCUMENTATION CLARIFICATION QUERY  THIS DOCUMENT IS NOT A PERMANENT PART OF THE MEDICAL RECORD  Please update your documentation within the medical record to reflect your response to this query.                                                                                         05/09/11    Dear Dr.Ghimire / Associates,  In a better effort to capture your patient's severity of illness, reflect appropriate length of stay and utilization of resources, a review of the patient medical record has revealed the following indicators.   Based on your clinical judgment, please clarify and document in a progress note and/or discharge summary the clinical condition associated with the following supporting information: In responding to this query please exercise your independent judgment.  The fact that a query is asked, does not imply that any particular answer is desired or expected.  TO RESPOND TO THE THIS QUERY, FOLLOW THE INSTRUCTIONS BELOW:  1. If needed, update documentation for the patient's encounter via the notes activity.  2. Access this query again and click edit on the [employer].  3. After updating, or not, click F2 to complete all highlighted (required) fields concerning your review. Select "additional documentation in the medical record" OR "no additional documentation provided". 4. Click Sign note button.  5. The deficiency will fall out of your InBasket  *Please let us know if you are not able to compete this workflow by phone or e-mail (listed below).   "Left Sided Weakness" is documented in the progress notes. Please consider the below (if your clinical findings/judgment agree) as you document the patient's condition(s) in the progress note and discharge summary. Thank you!   Possible Clinical Conditions?   - LT hemiparesis ("resolved" - may be added when appropriated)   - Other condition (please document in the progress notes and/or discharge summary)   -  Cannot Clinically determine at this time   Supporting Information:   - Admission Diagnosis: CVA  - "Left upper extremity numbness and weakness" H&P  - "Per family, pt began having left sided weakness about 6pm." ED nursing note 11/26  - "LUE 4/5 strength. The pt. Has difficulty moving L arm purposefully." ED MD assessment 11/25   No additional documentation in chart upon review.   Thank You,  Eldred Manges  Clinical Documentation Specialist Health Information Management [place] Email: [email or web address]

## 2011-05-09 NOTE — Progress Notes (Signed)
PATIENT DETAILS Name: Leonard Lindsey Age: 77 y.o. Sex: male Date of Birth: [birth year] Admit Date: 05/07/2011 UEA:[id number],BJYNW G, MD  Subjective: Better today, seems to think that he has more strength on his left side now. Ambulating.  Objective: Vital signs in last 24 hours: Filed Vitals:   05/09/11 1109 05/09/11 1124 05/09/11 1129 05/09/11 1136  BP: 125/72 123/70 123/70 136/76  Pulse:      Temp:      TempSrc:      Resp: 20 21 21 22   Height:      Weight:      SpO2: 90% 95% 96% 94%    Weight change:   Body mass index is 32.70 kg/(m^2).  Intake/Output from previous day:  Intake/Output Summary (Last 24 hours) at 05/09/11 1403 Last data filed at 05/08/11 1900  Gross per 24 hour  Intake   1200 ml  Output    300 ml  Net    900 ml    PHYSICAL EXAM: Gen Exam: Awake and alert with clear speech.   Neck: Supple, No JVD.   Chest: B/L Clear.   CVS: S1 S2 Regular, no murmurs.  Abdomen: soft, BS +, non tender, non distended.  Extremities: no edema, lower extremities warm to touch. Neurologic: Almost no deficits now.  Skin: No Rash.   Wounds: N/A.    CONSULTS:  neurology-stroke team.  LAB RESULTS: CBC  Lab 05/07/11 1900  WBC 7.4  HGB 15.3  HCT 44.4  PLT 213  MCV 90.1  MCH 31.0  MCHC 34.5  RDW 13.3  LYMPHSABS 2.6  MONOABS 0.7  EOSABS 0.3  BASOSABS 0.1  BANDABS --    Chemistries   Lab 05/08/11 0815 05/07/11 1900  NA -- 136  K -- 4.0  CL -- 103  CO2 -- 25  GLUCOSE -- 101*  BUN -- 28*  CREATININE 0.96 1.37*  CALCIUM -- 9.3  MG -- --    GFR Estimated Creatinine Clearance: 77.7 ml/min (by C-G formula based on Cr of 0.96).  Coagulation profile  Lab 05/07/11 1900  INR 1.15  PROTIME --    Cardiac Enzymes No results found for this basename: CK:3,CKMB:3,TROPONINI:3,MYOGLOBIN:3 in the last 168 hours  No results found for this basename: POCBNP:3 in the last 168 hours No results found for this basename: DDIMER:2 in the last 72 hours  Basename  05/08/11 0815  HGBA1C 5.9*    Basename 05/09/11 0655  CHOL 144  HDL 30*  LDLCALC 64  TRIG 295*  CHOLHDL 4.8  LDLDIRECT --   No results found for this basename: TSH,T4TOTAL,FREET3,T3FREE,THYROIDAB in the last 72 hours No results found for this basename: VITAMINB12:2,FOLATE:2,FERRITIN:2,TIBC:2,IRON:2,RETICCTPCT:2 in the last 72 hours No results found for this basename: LIPASE:2,AMYLASE:2 in the last 72 hours  Urine Studies No results found for this basename: UACOL:2,UAPR:2,USPG:2,UPH:2,UTP:2,UGL:2,UKET:2,UBIL:2,UHGB:2,UNIT:2,UROB:2,ULEU:2,UEPI:2,UWBC:2,URBC:2,UBAC:2,CAST:2,CRYS:2,UCOM:2,BILUA:2 in the last 72 hours  MICROBIOLOGY: No results found for this or any previous visit (from the past 240 hour(s)).  RADIOLOGY STUDIES/RESULTS: Dg Chest 2 View  05/08/2011  *RADIOLOGY REPORT*  Clinical Data: Stroke, hypertension.  CHEST - 2 VIEW  Comparison: 11/16/2009  Findings: Heart and mediastinal contours are within normal limits. No focal opacities or effusions.  No acute bony abnormality. Degenerative changes in the thoracic spine.  IMPRESSION: No active cardiopulmonary disease.  Original Report Authenticated By: Cyndie Chime, M.D.   Ct Head Wo Contrast  05/07/2011  *RADIOLOGY REPORT*  Clinical Data: Code stroke.  Left arm numbness.  CT HEAD WITHOUT CONTRAST  Technique:  Contiguous axial images were  obtained from the base of the skull through the vertex without contrast.  Comparison: 08/31/2004.  Findings: Atrophy and chronic ischemic white matter disease is present.  There is low attenuation posterior to the body and atrium of the right lateral ventricle in the right parietal lobe, suspicious for infarction.  Chronic ischemic white matter disease could mimic this appearance.  No hemorrhage is identified.  Mastoid air cells appear clear.  Paranasal sinuses clear.  No mass effect or midline shift. Scattered small lacunar infarcts are present in the sub insular regions.  IMPRESSION: 1.   Periventricular low attenuation in the right parietal lobe could represent acute or subacute infarct or chronic ischemic change but was not present on the prior exam of 2006. Critical Value/emergent results were called by telephone at the time of interpretation on 05/07/2011  at 1914 hours  to  Dr. Adriana Simas, who verbally acknowledged these results. 2.  Atrophy and chronic ischemic white matter disease.  Original Report Authenticated By: Andreas Newport, M.D.   Mr Brain Wo Contrast  05/08/2011  *RADIOLOGY REPORT*  Clinical Data:  Stroke.  Left arm numbness  MRI HEAD WITHOUT CONTRAST MRA HEAD WITHOUT CONTRAST  Technique:  Multiplanar, multiecho pulse sequences of the brain and surrounding structures were obtained without intravenous contrast. Angiographic images of the head were obtained using MRA technique without contrast.  Comparison:  CT 05/07/2011  MRI HEAD  Findings:  Scattered areas of acute infarction in the right occipital parietal lobe involving gray matter and white matter. Multiple small areas of acute infarction are present in the right posterior cerebral artery territory.  Generalized atrophy.  Chronic ischemia is present in the white matter bilaterally.  Small chronic infarct right cerebellum. Brainstem is intact.  Negative for hemorrhage or mass.  IMPRESSION: Atrophy and chronic microvascular ischemia.  Multiple small areas of acute infarction in the right occipital parietal lobe.  MRA HEAD  Findings: Both vertebral arteries are patent to the basilar.  Left vertebral is dominant.  Posterior cerebral arteries are patent bilaterally without significant stenosis.  The patient has an acute right posterior cerebral artery infarct however the right posterior cerebral artery is widely patent suggesting possible emboli.  Internal carotid artery is widely patent bilaterally.  Anterior and middle cerebral arteries are patent bilaterally without stenosis. Single combined A2 segment is present which is a normal  variant. Negative for aneurysm.  IMPRESSION: Negative intracranial MRA.  Original Report Authenticated By: Camelia Phenes, M.D.   Mr Mra Head/brain Wo Cm  05/08/2011  *RADIOLOGY REPORT*  Clinical Data:  Stroke.  Left arm numbness  MRI HEAD WITHOUT CONTRAST MRA HEAD WITHOUT CONTRAST  Technique:  Multiplanar, multiecho pulse sequences of the brain and surrounding structures were obtained without intravenous contrast. Angiographic images of the head were obtained using MRA technique without contrast.  Comparison:  CT 05/07/2011  MRI HEAD  Findings:  Scattered areas of acute infarction in the right occipital parietal lobe involving gray matter and white matter. Multiple small areas of acute infarction are present in the right posterior cerebral artery territory.  Generalized atrophy.  Chronic ischemia is present in the white matter bilaterally.  Small chronic infarct right cerebellum. Brainstem is intact.  Negative for hemorrhage or mass.  IMPRESSION: Atrophy and chronic microvascular ischemia.  Multiple small areas of acute infarction in the right occipital parietal lobe.  MRA HEAD  Findings: Both vertebral arteries are patent to the basilar.  Left vertebral is dominant.  Posterior cerebral arteries are patent bilaterally without significant stenosis.  The patient has an acute right posterior cerebral artery infarct however the right posterior cerebral artery is widely patent suggesting possible emboli.  Internal carotid artery is widely patent bilaterally.  Anterior and middle cerebral arteries are patent bilaterally without stenosis. Single combined A2 segment is present which is a normal variant. Negative for aneurysm.  IMPRESSION: Negative intracranial MRA.  Original Report Authenticated By: Camelia Phenes, M.D.    MEDICATIONS: Scheduled Meds:    . clopidogrel  75 mg Oral Q breakfast  . cyanocobalamin  1,000 mcg Intramuscular Q30 days  . diltiazem  180 mg Oral QAM  . dutasteride  0.5 mg Oral QHS  .  enoxaparin  40 mg Subcutaneous Q24H  . levothyroxine  137 mcg Oral QAM  . rosuvastatin  20 mg Oral Daily  . Tamsulosin HCl  0.4 mg Oral QHS   Continuous Infusions:    . sodium chloride 500 mL (05/09/11 1007)   PRN Meds:.labetalol, ondansetron (ZOFRAN) IV, senna-docusate, DISCONTD: butamben-tetracaine-benzocaine, DISCONTD: fentaNYL, DISCONTD: midazolam  Antibiotics: Anti-infectives    None      Assessment/Plan: Patient Active Hospital Problem List: CVA (cerebral infarction)    Assessment: Likely embolic    Plan: Continue with Plavix and Crestor, TEE results noted. Awaiting lower extremity venous Doppler to make sure no DVT, as small PFO seen in TEE. Carotid Doppler shows left sided stenosis of around 40-59%.  Hypertension    Assessment: Stable    Plan: Continue with Cardizem. Allow for some permissive hypertension.   Hypothyroid    Assessment: Stable    Plan: Continue with Synthroid.   Renal insufficiency    Assessment: Now resolved.    Plan: Monitor periodically.   BPH (benign prostatic hyperplasia)    Assessment: Stable    Plan:  continue with Flomax and Avodart.   Disposition: Remain inpatient-discharge home likely in a.m.  DVT Prophylaxis:  sub-cutaneous Lovenox.  Code Status: Full code  Maretta Bees, MD. 05/09/2011, 2:03 PM

## 2011-05-09 NOTE — Progress Notes (Signed)
Have alerted mary w adv homecare of [facility] for today.

## 2011-05-09 NOTE — Progress Notes (Signed)
Physical Therapy Treatment Patient Details Name: SHOUA RESSLER MRN: [id number] DOB: 07-Jan-1933 Today's Date: 05/09/2011  PT Assessment/Plan  PT - Assessment/Plan Comments on Treatment Session: Progressing with PT goals.  Pt eager to D/C home.  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   PT Plan: Discharge plan remains appropriate PT Frequency: Min 4X/week Follow Up Recommendations: Home health PT Equipment Recommended: None recommended by OT PT Goals  Acute Rehab PT Goals PT Goal: Stand - Progress: Progressing toward goal PT Goal: Ambulate - Progress: Progressing toward goal  PT Treatment Precautions/Restrictions  Precautions Precautions: Fall Required Braces or Orthoses: No Restrictions Weight Bearing Restrictions: No Mobility (including Balance) Bed Mobility Bed Mobility: No Transfers Sit to Stand: 5: Supervision;With upper extremity assist;With armrests;From chair/3-in-1 Sit to Stand Details (indicate cue type and reason): cues for LUE placement/use; 4x's Stand to Sit: 5: Supervision Stand to Sit Details: cues for placement/use of LUE on armrest to control descent; pt able to locate armrest on left side with verbal cues for use of left arm.   Ambulation/Gait Ambulation/Gait Assistance: 5: Supervision Ambulation/Gait Assistance Details (indicate cue type and reason): performed various gait challenges such as vertical/horizontal head turns, gait velocity changes, directional changes.  No LOB noted with challenges but pt did have mild lateral sway.   Ambulation Distance (Feet): 200 Feet Assistive device: Straight cane Gait Pattern: Step-through pattern;Decreased trunk rotation;Decreased stride length  Dynamic Standing Balance Dynamic Standing - Balance Support:  (placed 2 fingers on window seal for support with RUE) Dynamic Standing - Level of Assistance: 5:  Stand by assistance Dynamic Standing - Balance Activities: Lateral lean/weight shifting;Forward lean/weight shifting;Reaching for objects;Reaching across midline Dynamic Standing - Comments: picking objects up off floor to facilitate lateral wt shifting, reaching high<>low, crossing midline, leaning forward, lowering COG, alternating hip/knee flexion Exercise    End of Session PT - End of Session Equipment Utilized During Treatment: Gait belt Activity Tolerance: Patient tolerated treatment well Patient left: in bed General Behavior During Session: [facility] for tasks performed Cognition: [facility] for tasks performed  Lara Mulch 05/09/2011, 9:20 AM [phone]

## 2011-05-10 ENCOUNTER — Encounter (HOSPITAL_COMMUNITY): Payer: Self-pay | Admitting: Cardiology

## 2011-05-10 LAB — URINE CULTURE

## 2011-05-10 LAB — GLUCOSE, CAPILLARY
Glucose-Capillary: 104 mg/dL — ABNORMAL HIGH (ref 70–99)
Glucose-Capillary: 114 mg/dL — ABNORMAL HIGH (ref 70–99)

## 2011-05-10 MED ORDER — CLOPIDOGREL BISULFATE 75 MG PO TABS: 75.0000 mg | ORAL_TABLET | Freq: Every day | ORAL | Status: AC

## 2011-05-10 NOTE — Progress Notes (Signed)
Physical Therapy Treatment Patient Details Name: Leonard Lindsey MRN: [id number] DOB: September 15, 1932 Today's Date: 05/10/2011  PT Assessment/Plan  PT - Assessment/Plan Comments on Treatment Session: Patient presents with significant improvement in balance and now with no visual deficits noted. Deficits that remain appear to be largely pre-mobid with the exception of the left upper extremity PT Plan: Discharge plan remains appropriate Follow Up Recommendations: Home health PT PT Goals  Acute Rehab PT Goals PT Goal: Supine/Side to Sit - Progress: Met PT Transfer Goal: Sit to Stand/Stand to Sit - Progress: Met PT Goal: Stand - Progress: Progressing toward goal PT Goal: Ambulate - Progress: Progressing toward goal PT Goal: Up/Down Stairs - Progress: Progressing toward goal  PT Treatment Precautions/Restrictions  Precautions Precautions: Fall Required Braces or Orthoses: No Restrictions Weight Bearing Restrictions: No Mobility (including Balance) Bed Mobility Bed Mobility: Yes Supine to Sit: 6: Modified independent (Device/Increase time);HOB elevated (Comment degrees) (20 degrees) Transfers Sit to Stand: 6: Modified independent (Device/Increase time);From bed;With upper extremity assist;From chair/3-in-1 Sit to Stand Details (indicate cue type and reason): Patient with increased time to assume standing position - states joint stiffness. Improved efficiency with second stand from chair Stand to Sit: 6: Modified independent (Device/Increase time);With upper extremity assist;To chair/3-in-1 Stand to Sit Details: Good control of descent with equal upper extemity use Ambulation/Gait Ambulation/Gait Assistance: 5: Supervision Ambulation/Gait Assistance Details (indicate cue type and reason): Continued with gait challenges - head turns, 180 and 360 degree turns. Despite slow execution no evidence of imbalance Ambulation Distance (Feet): 275 Feet Assistive device: Straight cane Gait Pattern:  Trunk flexed;Decreased stride length;Step-through pattern Stairs Assistance: 5: Supervision Stair Management Technique: One rail Left;With cane (alternating ascending step to descending) Number of Stairs: 2  Height of Stairs: 8   Dynamic Sitting Balance Dynamic Sitting - Balance Support: No upper extremity supported Dynamic Sitting - Level of Assistance: 7: Independent Dynamic Standing Balance Dynamic Standing - Balance Support: Right upper extremity supported (with progression to no support) Dynamic Standing - Level of Assistance: 5: Stand by assistance Dynamic Standing - Balance Activities: Lateral lean/weight shifting;Forward lean/weight shifting;Reaching for objects;Reaching across midline End of Session PT - End of Session Equipment Utilized During Treatment: Gait belt Activity Tolerance: Patient tolerated treatment well Patient left: in chair General Behavior During Session: [facility] for tasks performed Cognition: [facility] for tasks performed  Edwyna Perfect, PT  Pager [phone]  05/10/2011, 9:59 AM

## 2011-05-10 NOTE — Progress Notes (Signed)
Stroke Team Progress Note  SUBJECTIVE [name] is a 77 y.o. male who feels their symptoms  are now gradually improving. Plans are to discharge today. No new complaints.LEvenous dopplers negative for DVT.  OBJECTIVE Most recent Vital Signs: Temp: 98.3 F (36.8 C) (11/28 1024) Temp src: Oral (11/28 1024) BP: 129/69 mmHg (11/28 1024) Pulse Rate: 73  (11/28 1024) Respiratory Rate: 18 O2 Saturdation: 94%  CBG (last 3)   Basename 05/10/11 1145 05/10/11 0624 05/09/11 2211  GLUCAP 114* 104* 105*   Intake/Output from previous day: 11/27 0701 - 11/28 0700 In: 690 [P.O.:240; I.V.:450] Out: 1050 [Urine:1050]  Diet  Cardiac thin liquids  Activity  Up with assistance  DVT Prophylaxis  Lovenox 40 mg sq daily   Studies Results for orders placed during the hospital encounter of 05/07/11 (from the past 24 hour(s))  URINALYSIS, ROUTINE W REFLEX MICROSCOPIC     Status: Normal   Collection Time   05/09/11  3:04 PM      Component Value Range   Color, Urine YELLOW  YELLOW    Appearance CLEAR  CLEAR    Specific Gravity, Urine 1.020  1.005 - 1.030    pH 6.0  5.0 - 8.0    Glucose, UA NEGATIVE  NEGATIVE (mg/dL)   Hgb urine dipstick NEGATIVE  NEGATIVE    Bilirubin Urine NEGATIVE  NEGATIVE    Ketones, ur NEGATIVE  NEGATIVE (mg/dL)   Protein, ur NEGATIVE  NEGATIVE (mg/dL)   Urobilinogen, UA 1.0  0.0 - 1.0 (mg/dL)   Nitrite NEGATIVE  NEGATIVE    Leukocytes, UA NEGATIVE  NEGATIVE     LE venous dopplers negative for DVT  Physical Exam  Pleasant elderly Caucasian male currently not in distress. Afebrile. Neck is supple without bruit. Hearing is normal. No pedal edema. Cardiac exam no murmur or gallop. Lungs clear to auscultation. Neurological exam awake alert oriented to time place and person. Speech and language appear norma.lEye movements are full range without nystagmus. There is mild left lower facial weakness. Tongue is midline. No upper extremity drift but and weakness of the left  grip and intrinsic hand muscles. Mild left finger-to-nose dysmetria. Mild weakness of foot tapping on the left. Mild left any sensory loss on the left. Gait is slightly ataxic.  ASSESSMENT [name] is a 77 y.o. male with a acute infarcts in the right occipital lobe, secondary to unknown embolic source, on clopidogrel 75 mg orally every day for secondary stroke prevention.  PFO, likely incidental finding.  Urinary incontinence and dysuria - neg UA.  Stroke risk factors:  hypertension and previous stroke  Hospital day # 3  TREATMENT/PLAN Continue Plavix for secondary stroke prevention. Home health PT & OT. Please schedule an outpatient TCD bubble study with emboli monitoring with Dr. Pearlean Brownie in 1 month to further evaluate PFO. Have patient call for appointment. Consider IRIS study (Insulin Resistance in Stroke) through [facility]. Looking at the use of Actos in secondary stroke prevention in patients who have had a stroke. They will contact patient.  Will sign off. Follow up with Dr. Pearlean Brownie at time of the TCD.   Joaquin Music, ANP-BC, GNP-BC [facility] Pager: [phone] 05/10/2011 12:45 PM  Dr. Delia Heady, Stroke Center Medical Director, has personally reviewed chart, pertinent data, examined the patient and developed the plan of care.

## 2011-05-10 NOTE — Discharge Summary (Signed)
PATIENT DETAILS Name: Leonard Lindsey Age: 77 y.o. Sex: male Date of Birth: [birth year] MRN: [id number]. 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 Hypertension  Hypothyroid  Renal insufficiency  BPH (benign prostatic hyperplasia)      PAST MEDICAL HISTORY: Past Medical History  Diagnosis Date  . Hypertension   . Stroke 31yrs ago  . Hypothyroid     DISCHARGE MEDICATIONS: Current Discharge Medication List    START taking these medications   Details  clopidogrel (PLAVIX) 75 MG tablet Take 1 tablet (75 mg total) by mouth daily with breakfast. Qty: 30 tablet, Refills: 0      CONTINUE these medications which have NOT CHANGED   Details  atorvastatin (LIPITOR) 40 MG tablet Take 40 mg by mouth at bedtime.      cyanocobalamin (,VITAMIN B-12,) 1000 MCG/ML injection Inject 1,000 mcg into the muscle every 30 (thirty) days.      diltiazem (CARDIZEM CD) 180 MG 24 hr capsule Take 180 mg by mouth every morning.      dutasteride (AVODART) 0.5 MG capsule Take 0.5 mg by mouth at bedtime.     ibuprofen (ADVIL,MOTRIN) 800 MG tablet Take 800 mg by mouth 2 (two) times daily.     levothyroxine (SYNTHROID, LEVOTHROID) 137 MCG tablet Take 137 mcg by mouth every morning.     ramipril (ALTACE) 1.25 MG capsule Take 1.25 mg by mouth every morning.     Tamsulosin HCl (FLOMAX) 0.4 MG CAPS Take 0.4 mg by mouth at bedtime.     triamterene-hydrochlorothiazide (DYAZIDE) 37.5-25 MG per capsule Take 1 capsule by mouth every morning.        STOP taking these medications     aspirin 81 MG tablet          BRIEF HPI:  See H&P, Labs, Consult and Test reports for all details in brief, patient was admitted for left upper extremity weakness and numbness. Symptoms were  highly suggestive of an acute CVA. He was then admitted to the hospitalist service. For further details please see the history and physical that was done on admission.  CONSULTATIONS:   neurology [place] cardiology for TEE  PERTINENT RADIOLOGIC STUDIES: Dg Chest 2 View  05/08/2011  *RADIOLOGY REPORT*  Clinical Data: Stroke, hypertension.  CHEST - 2 VIEW  Comparison: 11/16/2009  Findings: Heart and mediastinal contours are within normal limits. No focal opacities or effusions.  No acute bony abnormality. Degenerative changes in the thoracic spine.  IMPRESSION: No active cardiopulmonary disease.  Original Report Authenticated By: Cyndie Chime, M.D.   Ct Head Wo Contrast  05/07/2011  *RADIOLOGY REPORT*  Clinical Data: Code stroke.  Left arm numbness.  CT HEAD WITHOUT CONTRAST  Technique:  Contiguous axial images were obtained from the base of the skull through the vertex without contrast.  Comparison: 08/31/2004.  Findings: Atrophy and chronic ischemic white matter disease is present.  There is low attenuation posterior to the body and atrium of the right lateral ventricle in the right parietal lobe, suspicious for infarction.  Chronic ischemic white matter disease could mimic this appearance.  No hemorrhage is identified.  Mastoid air cells appear clear.  Paranasal sinuses clear.  No mass effect or midline shift. Scattered small lacunar infarcts are present in the sub insular regions.  IMPRESSION: 1.  Periventricular low attenuation in the right parietal  lobe could represent acute or subacute infarct or chronic ischemic change but was not present on the prior exam of 2006. Critical Value/emergent results were called by telephone at the time of interpretation on 05/07/2011  at 1914 hours  to  Dr. Adriana Simas, who verbally acknowledged these results. 2.  Atrophy and chronic ischemic white matter disease.  Original Report Authenticated By: Andreas Newport, M.D.   Mr Brain Wo Contrast  05/08/2011  *RADIOLOGY  REPORT*  Clinical Data:  Stroke.  Left arm numbness  MRI HEAD WITHOUT CONTRAST MRA HEAD WITHOUT CONTRAST  Technique:  Multiplanar, multiecho pulse sequences of the brain and surrounding structures were obtained without intravenous contrast. Angiographic images of the head were obtained using MRA technique without contrast.  Comparison:  CT 05/07/2011  MRI HEAD  Findings:  Scattered areas of acute infarction in the right occipital parietal lobe involving gray matter and white matter. Multiple small areas of acute infarction are present in the right posterior cerebral artery territory.  Generalized atrophy.  Chronic ischemia is present in the white matter bilaterally.  Small chronic infarct right cerebellum. Brainstem is intact.  Negative for hemorrhage or mass.  IMPRESSION: Atrophy and chronic microvascular ischemia.  Multiple small areas of acute infarction in the right occipital parietal lobe.  MRA HEAD  Findings: Both vertebral arteries are patent to the basilar.  Left vertebral is dominant.  Posterior cerebral arteries are patent bilaterally without significant stenosis.  The patient has an acute right posterior cerebral artery infarct however the right posterior cerebral artery is widely patent suggesting possible emboli.  Internal carotid artery is widely patent bilaterally.  Anterior and middle cerebral arteries are patent bilaterally without stenosis. Single combined A2 segment is present which is a normal variant. Negative for aneurysm.  IMPRESSION: Negative intracranial MRA.  Original Report Authenticated By: Camelia Phenes, M.D.   Mr Mra Head/brain Wo Cm  05/08/2011  *RADIOLOGY REPORT*  Clinical Data:  Stroke.  Left arm numbness  MRI HEAD WITHOUT CONTRAST MRA HEAD WITHOUT CONTRAST  Technique:  Multiplanar, multiecho pulse sequences of the brain and surrounding structures were obtained without intravenous contrast. Angiographic images of the head were obtained using MRA technique without contrast.   Comparison:  CT 05/07/2011  MRI HEAD  Findings:  Scattered areas of acute infarction in the right occipital parietal lobe involving gray matter and white matter. Multiple small areas of acute infarction are present in the right posterior cerebral artery territory.  Generalized atrophy.  Chronic ischemia is present in the white matter bilaterally.  Small chronic infarct right cerebellum. Brainstem is intact.  Negative for hemorrhage or mass.  IMPRESSION: Atrophy and chronic microvascular ischemia.  Multiple small areas of acute infarction in the right occipital parietal lobe.  MRA HEAD  Findings: Both vertebral arteries are patent to the basilar.  Left vertebral is dominant.  Posterior cerebral arteries are patent bilaterally without significant stenosis.  The patient has an acute right posterior cerebral artery infarct however the right posterior cerebral artery is widely patent suggesting possible emboli.  Internal carotid artery is widely patent bilaterally.  Anterior and middle cerebral arteries are patent bilaterally without stenosis. Single combined A2 segment is present which is a normal variant. Negative for aneurysm.  IMPRESSION: Negative intracranial MRA.  Original Report Authenticated By: Camelia Phenes, M.D.     PERTINENT LAB RESULTS: CBC:  Basename 05/07/11 1900  WBC 7.4  HGB 15.3  HCT 44.4  PLT 213   CMET CMP     Component Value Date/Time  NA 136 05/07/2011 1900   K 4.0 05/07/2011 1900   CL 103 05/07/2011 1900   CO2 25 05/07/2011 1900   GLUCOSE 101* 05/07/2011 1900   BUN 28* 05/07/2011 1900   CREATININE 0.96 05/08/2011 0815   CALCIUM 9.3 05/07/2011 1900   PROT 6.6 05/07/2011 1900   ALBUMIN 3.8 05/07/2011 1900   AST 30 05/07/2011 1900   ALT 29 05/07/2011 1900   ALKPHOS 59 05/07/2011 1900   BILITOT 0.3 05/07/2011 1900   GFRNONAA 79* 05/08/2011 0815   GFRAA >90 05/08/2011 0815    GFR Estimated Creatinine Clearance: 77.7 ml/min (by C-G formula based on Cr of 0.96). No  results found for this basename: LIPASE:2,AMYLASE:2 in the last 72 hours No results found for this basename: CKTOTAL:3,CKMB:3,CKMBINDEX:3,TROPONINI:3 in the last 72 hours No results found for this basename: POCBNP:3 in the last 72 hours No results found for this basename: DDIMER:2 in the last 72 hours  Basename 05/08/11 0815  HGBA1C 5.9*    Basename 05/09/11 0655  CHOL 144  HDL 30*  LDLCALC 64  TRIG 161*  CHOLHDL 4.8  LDLDIRECT --   No results found for this basename: TSH,T4TOTAL,FREET3,T3FREE,THYROIDAB in the last 72 hours No results found for this basename: VITAMINB12:2,FOLATE:2,FERRITIN:2,TIBC:2,IRON:2,RETICCTPCT:2 in the last 72 hours Coags:  Basename 05/07/11 1900  INR 1.15   Microbiology: No results found for this or any previous visit (from the past 240 hour(s)).   BRIEF HOSPITAL COURSE:   Principal Problem:  *CVA (cerebral infarction) -Patient was admitted, underwent a stroke workup. MRI results are indicated as above. Since this is highly suspicious of an embolic stroke, patient underwent a TEE, which showed atrial septal aneurysm and there was a possible PFO. Patient underwent a lower extremity venous Doppler which did not show any clot. Patient has been cleared by neurology for discharge. -He was on aspirin when this stroke happened, so he will be transitioned over to Plavix. -Because of the suspected embolic nature of the stroke, [place] cardiology has been consulted for arrangement of an outpatient the week event monitor to make sure the patient does not atrial fibrillation. -Patient does have around 40-59% stenosis of his left carotid, he would need outpatient surveillance monitoring for this. We will defer this to his primary care practitioner. -Please note his signs and symptoms have by a large mostly resolved at time of discharge and  Active Problems:  Hypertension -Controlled, patient is to continue the diaphragm and ramipril. He is to also resume his usual  diuretics as well  Dyslipidemia -Continue with statins.   Hypothyroid -Continue with levothyroxine.   BPH (benign prostatic hyperplasia)  -Continue with Flomax and Avodart.  Left carotid stenosis -Will need periodic outpatient surveillance. We'll defer to patient's primary care practitioner.   TODAY-DAY OF DISCHARGE:  Subjective:   Kaidyn Javid today has no headache,no chest abdominal pain,no new weakness tingling or numbness, feels much better wants to go home today.  Objective:   Blood pressure 104/61, pulse 65, temperature 98.3 F (36.8 C), temperature source Oral, resp. rate 19, height 5' 9.5" (1.765 m), weight 101.9 kg (224 lb 10.4 oz), SpO2 95.00%.  Intake/Output Summary (Last 24 hours) at 05/10/11 1008 Last data filed at 05/10/11 0659  Gross per 24 hour  Intake    690 ml  Output    800 ml  Net   -110 ml    Exam Awake Alert, Oriented *3, No new F.N deficits, Normal affect [place].AT,PERRAL Supple Neck,No JVD, No cervical lymphadenopathy appriciated.  Symmetrical Chest wall  movement, Good air movement bilaterally, CTAB RRR,No Gallops,Rubs or new Murmurs, No Parasternal Heave +ve B.Sounds, Abd Soft, Non tender, No organomegaly appriciated, No rebound -guarding or rigidity. No Cyanosis, Clubbing or edema, No new Rash or bruise  DISPOSITION: Home with home health services.  DISCHARGE INSTRUCTIONS:    Follow-up Information    Follow up with MCINNIS,ANGUS G. Make an appointment in 1 week.   Contact information:   [address] [place] [address] [phone]       Follow up with Gates Rigg, MD. Make an appointment in 1 month.   Contact information:   [address] [facility] [place] [address] [phone]       Follow up with [facility] [address] (will arrange for 3 week outpatient event mornitor)    Contact information:   [address] [place] [address]             Total Time spent on discharge equals 45 minutes.  SignedJeoffrey Massed 05/10/2011 10:08 AM

## 2011-05-10 NOTE — Progress Notes (Signed)
Occupational Therapy Evaluation Patient Details Name: Leonard Lindsey MRN: [id number] DOB: 1932/08/20 Today's Date: 05/10/2011 11:30-12:02   2te OT Assessment/Plan OT Assessment/Plan Comments on Treatment Session: Pt continues to make good progress with LUE function but still demonstrates decreased gross and FM coordination.  Also still exhibits decreased peripheral vision to the left side, but seems to be improving.  Educated wife and pt on FM coordination exercises and provided handout as well.  Also issued small foam ball to increase grip strength and manipulation as well. Pt planning to d/c home today.  Recommend continued home health to progress to modified independent level for all ADLs and to increase coordination and functional use of the left UE.  OT Goals Arm Goals Arm Goal: Additional Goal #1 - Progress: Progressing toward goals  OT Treatment Precautions/Restrictions  Precautions Precautions: Fall Restrictions Weight Bearing Restrictions: No   ADL ADL Eating/Feeding: Not assessed Grooming: Performed;Brushing hair;Supervision/safety (Pt used the left hand with increased time) Where Assessed - Grooming: Sitting, chair Upper Body Bathing: Simulated;Supervision/safety Where Assessed - Upper Body Bathing: Unsupported;Sitting, bed Lower Body Bathing: Simulated;Supervision/safety ADL Comments: Pt is continuing to make steady progress with OT regarding RUE functional use.  Still continues to exhibit decreased gross and fine motor coordination Mobility    End of Session OT - End of Session Activity Tolerance: Patient tolerated treatment well Patient left: in chair;with call bell in reach;with family/visitor present General Behavior During Session: [facility] for tasks performed Cognition: [facility] for tasks performed  Roza Creamer,[place] OTR/L 05/10/2011, 1:04 PM Pager number [phone]

## 2011-05-23 ENCOUNTER — Encounter: Payer: Self-pay | Admitting: Cardiology

## 2011-05-23 ENCOUNTER — Ambulatory Visit (INDEPENDENT_AMBULATORY_CARE_PROVIDER_SITE_OTHER): Payer: Medicare Other | Admitting: Cardiology

## 2011-05-23 DIAGNOSIS — I6350 Cerebral infarction due to unspecified occlusion or stenosis of unspecified cerebral artery: Secondary | ICD-10-CM

## 2011-05-23 DIAGNOSIS — I253 Aneurysm of heart: Secondary | ICD-10-CM

## 2011-05-23 DIAGNOSIS — E890 Postprocedural hypothyroidism: Secondary | ICD-10-CM

## 2011-05-23 DIAGNOSIS — E782 Mixed hyperlipidemia: Secondary | ICD-10-CM | POA: Insufficient documentation

## 2011-05-23 DIAGNOSIS — I639 Cerebral infarction, unspecified: Secondary | ICD-10-CM

## 2011-05-23 DIAGNOSIS — I10 Essential (primary) hypertension: Secondary | ICD-10-CM

## 2011-05-23 DIAGNOSIS — I779 Disorder of arteries and arterioles, unspecified: Secondary | ICD-10-CM | POA: Insufficient documentation

## 2011-05-23 NOTE — Assessment & Plan Note (Signed)
Continue medical therapy and follow up with Dr. Renard Matter.

## 2011-05-23 NOTE — Assessment & Plan Note (Signed)
Has been on Lipitor long-term.

## 2011-05-23 NOTE — Assessment & Plan Note (Signed)
Continues on Synthroid, recent TSH normal.

## 2011-05-23 NOTE — Patient Instructions (Signed)
Your physician recommends that you schedule a follow-up appointment in: As needed depending on results of Cardionet Monitor Follow up within the next 2 weeks with Dr Pearlean Brownie  Your physician has recommended that you wear an event monitor. Event monitors are medical devices that record the heart's electrical activity. Doctors most often [place] these monitors to diagnose arrhythmias. Arrhythmias are problems with the speed or rhythm of the heartbeat. The monitor is a small, portable device. You can wear one while you do your normal daily activities. This is usually used to diagnose what is causing palpitations/syncope (passing out).

## 2011-05-23 NOTE — Assessment & Plan Note (Signed)
Not clear that this is of any major clinical significance. There was no description of any definitive PFO or ASD in the TEE report, however positive bubble study noted. Typically there are some fenestrations associated with the septum in the setting of an aneurysm which could be an explanation for this finding. There is no evidence of right-sided volume overload to suggest any hemodynamic significance. No changes made to medical therapy.

## 2011-05-23 NOTE — Progress Notes (Signed)
Clinical Summary Leonard Lindsey is a 77 y.o.male referred for cardiology consultation to assist with outpatient cardiac monitoring to exclude arrhythmia in light of recent documentation of apparent embolic stroke that occurred in November. Discharge summary from [facility] was reviewed. Patient reports that with physical therapy, he has regained most of the strength in the left arm, also fine motor skills are better. He reports no speech deficits, states he has been walking for exercise. Only had mild left leg weakness initially.  He did undergo a TEE during his observation in the hospital which reported an atrial septal aneurysm with positive bubble study, however made no comment about definitive findings of ASD or PFO. No other intracardiac thrombus was described.  Patient has been treated with Plavix following consultation with neurology. He has not yet had an office followup visit with neurology since discharge.  He denies any sense of palpitations, has had no unusual dizziness or sudden syncope recently. No known history of cardiac arrhythmia. No chest pain. LVEF is 55-60% by recent evaluation with mild aortic and mitral regurgitation.   No Known Allergies  Medication list reviewed.  Past Medical History  Diagnosis Date  . Essential hypertension, benign   . Stroke     Prior history and more recent - likely embolic in rright occipital parietal lobe 11/12  . Hypothyroid     Following remote treatment of hyperthyroidism with radioactive iodine  . Mixed hyperlipidemia   . Prostatism   . Carotid artery disease     40-59% LICA 1/12  . Atrial septal aneurysm     TEE 11/12 with positive buble study - no definite ASD    Past Surgical History  Procedure Date  . Abdominal surgery     Tumor removal  . Tee without cardioversion 05/09/2011    Procedure: TRANSESOPHAGEAL ECHOCARDIOGRAM (TEE);  Surgeon: Lewayne Bunting, MD;  Location: [facility] ENDOSCOPY;  Service: Cardiovascular;  Laterality: N/A;     Family History  Problem Relation Age of Onset  . Anesthesia problems Neg Hx   . Hypotension Neg Hx   . Malignant hyperthermia Neg Hx   . Pseudochol deficiency Neg Hx     Social History Leonard Lindsey reports that he has never smoked. He has never used smokeless tobacco. Leonard Lindsey reports that he does not drink alcohol.  Review of Systems Negative except as outlined above.  Physical Examination Filed Vitals:   05/23/11 1338  BP: 133/75  Pulse: 78   Overweight male in no acute distress. HEENT: Conjunctiva and lids normal, oropharynx clear. Neck: Supple, no carotid bruits or elevated JVP, no thyromegaly. Lungs: Clear to auscultation, non-labored. Cardiac: Regular rate and rhythm, soft basal systolic murmur, no diastolic murmur or rub. No gallop. Abdomen: Soft, nontender, bowel sounds present. Skin: Warm and dry. Musculoskeletal: No gross deformities. Extremities: 1+ edema, distal pulses 1-2+. Neuropsychiatric: Alert and oriented x3, mild residual weakness of the left arm, no speech deficits.  ECG Tracing from November showed normal sinus rhythm at 72.    Problem List and Plan

## 2011-05-23 NOTE — Assessment & Plan Note (Signed)
Nonobstructive LICA disease by recent Dopplers. Continue antiplatelet and statin therapy.

## 2011-05-23 NOTE — Assessment & Plan Note (Signed)
Recent history and hospital evaluation reviewed. Patient is recovering well symptomatically. He continues on Plavix. No history of cardiac dysrhythmia, and baseline ECG was normal. LVEF also normal. No major valvular abnormalities. Plan is to provide a 7 day CardioNet monitor to exclude any subclinical atrial fibrillation that might require more aggressive treatment in light of his stroke. Otherwise would continue regular followup with primary care and with neurology. We took the liberty of contacting [facility] to schedule a followup for the patient with Dr. Pearlean Brownie.

## 2011-05-26 DIAGNOSIS — I4891 Unspecified atrial fibrillation: Secondary | ICD-10-CM

## 2011-06-19 ENCOUNTER — Other Ambulatory Visit: Payer: Self-pay | Admitting: Cardiology

## 2011-06-19 DIAGNOSIS — I639 Cerebral infarction, unspecified: Secondary | ICD-10-CM

## 2011-07-10 ENCOUNTER — Other Ambulatory Visit (HOSPITAL_COMMUNITY): Payer: Self-pay | Admitting: Sports Medicine

## 2011-07-10 DIAGNOSIS — M545 Low back pain: Secondary | ICD-10-CM

## 2011-07-14 ENCOUNTER — Ambulatory Visit (HOSPITAL_COMMUNITY): Admission: RE | Admit: 2011-07-14 | Payer: Medicare Other | Source: Ambulatory Visit

## 2011-07-18 ENCOUNTER — Ambulatory Visit (HOSPITAL_COMMUNITY)
Admission: RE | Admit: 2011-07-18 | Discharge: 2011-07-18 | Disposition: A | Discharge status: Home / Self Care | Payer: Medicare Other | Source: Ambulatory Visit | Attending: Sports Medicine | Admitting: Sports Medicine

## 2011-07-18 DIAGNOSIS — M545 Low back pain, unspecified: Secondary | ICD-10-CM | POA: Insufficient documentation

## 2011-07-18 DIAGNOSIS — M47817 Spondylosis without myelopathy or radiculopathy, lumbosacral region: Secondary | ICD-10-CM | POA: Insufficient documentation

## 2011-07-18 DIAGNOSIS — M79609 Pain in unspecified limb: Secondary | ICD-10-CM | POA: Insufficient documentation

## 2011-07-18 DIAGNOSIS — M5126 Other intervertebral disc displacement, lumbar region: Secondary | ICD-10-CM | POA: Insufficient documentation

## 2011-10-06 ENCOUNTER — Other Ambulatory Visit (HOSPITAL_COMMUNITY): Payer: Self-pay | Admitting: Family Medicine

## 2011-10-06 ENCOUNTER — Ambulatory Visit (HOSPITAL_COMMUNITY)
Admission: RE | Admit: 2011-10-06 | Discharge: 2011-10-06 | Disposition: A | Discharge status: Home / Self Care | Payer: Medicare Other | Source: Ambulatory Visit | Attending: Family Medicine | Admitting: Family Medicine

## 2011-10-06 DIAGNOSIS — R1903 Right lower quadrant abdominal swelling, mass and lump: Secondary | ICD-10-CM | POA: Insufficient documentation

## 2011-10-06 DIAGNOSIS — M549 Dorsalgia, unspecified: Secondary | ICD-10-CM

## 2011-10-06 LAB — POCT I-STAT, CHEM 8
Chloride: 109 mEq/L (ref 96–112)
HCT: 49 % (ref 39.0–52.0)
Potassium: 4.4 mEq/L (ref 3.5–5.1)
Sodium: 137 mEq/L (ref 135–145)

## 2011-10-06 MED ORDER — IOHEXOL 300 MG/ML  SOLN
100.0000 mL | Freq: Once | INTRAMUSCULAR | Status: AC | PRN
  Administered 2011-10-06: 100 mL via INTRAVENOUS

## 2011-10-11 ENCOUNTER — Other Ambulatory Visit (HOSPITAL_COMMUNITY): Payer: Self-pay | Admitting: Pulmonary Disease

## 2011-10-11 DIAGNOSIS — R911 Solitary pulmonary nodule: Secondary | ICD-10-CM

## 2011-10-13 ENCOUNTER — Ambulatory Visit (HOSPITAL_COMMUNITY)
Admission: RE | Admit: 2011-10-13 | Discharge: 2011-10-13 | Disposition: A | Discharge status: Home / Self Care | Payer: Medicare Other | Source: Ambulatory Visit | Attending: Pulmonary Disease | Admitting: Pulmonary Disease

## 2011-10-13 DIAGNOSIS — R911 Solitary pulmonary nodule: Secondary | ICD-10-CM | POA: Insufficient documentation

## 2011-10-13 MED ORDER — IOHEXOL 300 MG/ML  SOLN
80.0000 mL | Freq: Once | INTRAMUSCULAR | Status: AC | PRN
  Administered 2011-10-13: 80 mL via INTRAVENOUS

## 2011-10-17 ENCOUNTER — Encounter: Payer: Self-pay | Admitting: *Deleted

## 2011-10-17 NOTE — Progress Notes (Signed)
Fax referral to [facility].  Lorraine Lax stated she would set up appt for pt.

## 2011-10-24 ENCOUNTER — Encounter: Payer: Self-pay | Admitting: Thoracic Surgery (Cardiothoracic Vascular Surgery)

## 2011-10-24 ENCOUNTER — Other Ambulatory Visit: Payer: Self-pay | Admitting: Thoracic Surgery (Cardiothoracic Vascular Surgery)

## 2011-10-24 ENCOUNTER — Institutional Professional Consult (permissible substitution) (INDEPENDENT_AMBULATORY_CARE_PROVIDER_SITE_OTHER): Payer: Medicare Other | Admitting: Thoracic Surgery (Cardiothoracic Vascular Surgery)

## 2011-10-24 VITALS — BP 131/73 | HR 73 | Resp 16 | Ht 69.0 in | Wt 210.0 lb

## 2011-10-24 DIAGNOSIS — R911 Solitary pulmonary nodule: Secondary | ICD-10-CM

## 2011-10-24 DIAGNOSIS — R222 Localized swelling, mass and lump, trunk: Secondary | ICD-10-CM

## 2011-10-24 DIAGNOSIS — D381 Neoplasm of uncertain behavior of trachea, bronchus and lung: Secondary | ICD-10-CM

## 2011-10-24 DIAGNOSIS — J9859 Other diseases of mediastinum, not elsewhere classified: Secondary | ICD-10-CM

## 2011-10-24 NOTE — Progress Notes (Signed)
PCP is Alice Reichert, MD, MD Referring Provider is Fredirick Maudlin, MD  Chief Complaint  Patient presents with  . Lung Lesion    eval and treat..has had CT CHEST    HPI: 78 year old gentleman presents with a chief complaint of a lung nodule.  Leonard Lindsey was being evaluated for possible tumor on his back recently. He had a CT of the abdomen which showed that the question of the tumor was somehow related to his muscles. However on the lower chest cuts of the CT was seen to have a small left lower lobe lung nodule. A CT of the chest then was done which showed a 7 mm nodule in the left lower lobe with some small amount of surrounding groundglass opacity. Also noted was a 1.4 cm smooth round anterior mediastinal "mass".  He is a lifelong nonsmoker. He does say he was exposed to a lot of chemicals working in [employer]. He had asthma as a child, but has had no respiratory issues is an adult. He denies any fevers, chills, sweats or hemoptysis. He does have an occasional cough.   Past Medical History  Diagnosis Date  . Essential hypertension, benign   . Hypothyroid     Following remote treatment of hyperthyroidism with radioactive iodine  . Mixed hyperlipidemia   . Prostatism   . Carotid artery disease     40-59% LICA 1/12  . Atrial septal aneurysm     TEE 11/12 with positive buble study - no definite ASD  . Stroke     Prior history and more recent - likely embolic in rright occipital parietal lobe 11/12    Past Surgical History  Procedure Date  . Abdominal surgery     Tumor removal  . Tee without cardioversion 05/09/2011    Procedure: TRANSESOPHAGEAL ECHOCARDIOGRAM (TEE);  Surgeon: Lewayne Bunting, MD;  Location: [facility] ENDOSCOPY;  Service: Cardiovascular;  Laterality: N/A;  ? Partial gastrectomy for a benign tumor  Family History  Problem Relation Age of Onset  . Anesthesia problems Neg Hx   . Hypotension Neg Hx   . Malignant hyperthermia Neg Hx   . Pseudochol  deficiency Neg Hx     Social History History  Substance Use Topics  . Smoking status: Never Smoker   . Smokeless tobacco: Never Used  . Alcohol Use: No    Current Outpatient Prescriptions  Medication Sig Dispense Refill  . atorvastatin (LIPITOR) 40 MG tablet Take 40 mg by mouth at bedtime.        . clopidogrel (PLAVIX) 75 MG tablet Take 1 tablet (75 mg total) by mouth daily with breakfast.  30 tablet  0  . cyanocobalamin (,VITAMIN B-12,) 1000 MCG/ML injection Inject 1,000 mcg into the muscle every 30 (thirty) days.        . diclofenac sodium (VOLTAREN) 1 % GEL Apply topically. USES BID      . diltiazem (CARDIZEM CD) 180 MG 24 hr capsule Take 180 mg by mouth every morning.        . dutasteride (AVODART) 0.5 MG capsule Take 0.5 mg by mouth at bedtime.       Marland Kitchen levothyroxine (SYNTHROID, LEVOTHROID) 137 MCG tablet Take 137 mcg by mouth every morning.       . ramipril (ALTACE) 1.25 MG capsule Take 5 mg by mouth every morning.       . Tamsulosin HCl (FLOMAX) 0.4 MG CAPS Take 0.4 mg by mouth at bedtime.       . triamterene-hydrochlorothiazide (  DYAZIDE) 37.5-25 MG per capsule Take 1 capsule by mouth every morning.          No Known Allergies  Review of Systems  Respiratory: Negative for wheezing.        Asthma as a child  Cardiovascular: Negative for chest pain.  Genitourinary: Positive for frequency.  Musculoskeletal: Positive for myalgias (both legs below knees with walking, but not c/w claudication), joint swelling and arthralgias.  Neurological:       Stroke with left sided weakness and visual disturbance in 11/12. Minimal residual  All other systems reviewed and are negative.    BP 131/73  Pulse 73  Resp 16  Ht 5\' 9"  (1.753 m)  Wt 210 lb (95.255 kg)  BMI 31.01 kg/m2  SpO2 94% Physical Exam  Constitutional: He is oriented to person, place, and time. He appears well-developed and well-nourished. No distress.  HENT:  Head: Normocephalic and atraumatic.  Eyes: EOM are  normal. Pupils are equal, round, and reactive to light.  Neck: Normal range of motion. Neck supple. No thyromegaly present.  Cardiovascular: Normal rate, regular rhythm and intact distal pulses.   No murmur heard. Pulmonary/Chest: Effort normal and breath sounds normal.  Abdominal: Soft. There is no tenderness.  Musculoskeletal: He exhibits no edema.  Lymphadenopathy:    He has no cervical adenopathy.  Neurological: He is alert and oriented to person, place, and time. No cranial nerve deficit.  Skin: Skin is warm and dry.     Diagnostic Tests:  CT CHEST 10/13/11  IMPRESSION:  1. Irregular left lower lobe nodule measures 7 mm and is stable  from 10/06/2011. Mild surrounding ground-glass may be  inflammatory.  2. Prevascular nodule. Malignancy cannot be excluded.  3. Follow-up of the above findings could be performed in 3-6  months. If a more aggressive approach is desired, PET CT could be  performed. Note is made that the left lower lobe nodule may be too  small for PET resolution, however.   Impression: 23 year old nonsmoker with a newly discovered left lower lobe nodule as well as a small anterior mediastinal mass, which was labeled as a prevascular nodule by the radiologist. It is highly likely that both of these incidentally found lesions are separate and unrelated. As far as the anterior mediastinal nodule, this could represent a thymoma or a cyst. It has a relatively benign appearance. It was the only lesion I would definitely recommend radiographic followup. In regards to the lung nodule, it is small and relatively peripheral. Cancer cannot be ruled out based on the appearance of the nodule and must be a consideration even though he is a nonsmoker. I do think given the small size of this nodule it would be reasonable to follow it with serial CT scanning. Although this could be approached with ENB or CT-guided needle biopsy the incidence of false negative would potentially be high  and cannot definitively rule out cancer.  I discussed these issues with Leonard Lindsey. We discussed potential options which would be serial radiographic followup versus proceeding with left VATS for wedge resection with a segmentectomy or lobectomy and frozen section was positive for cancer. Obviously, if the wedge resection were negative a larger resection would not be necessary. We discussed the pros and cons of each of those approaches.  I did discuss with them the need to assess his pulmonary reserve with pulmonary function testing, as that would in fact are diagnostic workup. I do feel that his PFTs will be adequate given  that he is a nonsmoker.  I did discuss with them the basics of the left VATS and wedge resection followed by an anatomic resection the diagnosis of cancer was made. I also discussed that if he were going to proceed with VATS it would be reasonable to go ahead and resect the anterior mediastinal nodule at the same time. They understand the general nature of the video assisted thoracoscopy, including the need for general anesthesia, expected hospital stay, and overall recovery. He seemed quite surprised by the length of the hospital stay and recovery period for this type of operation. We did discuss the indications, risks, benefits, and alternatives (radiographic followup). They understand the risks include but are not limited to death, stroke, MI, DVT, PE, bleeding, possible need for transfusion, infection, as well as other unforeseeable or unpredictable complications.  His wife is scheduled to have surgery next week to replace her gastric pacemaker. He wishes to wait until after she's had a chance to recover a little before making a final decision. I will plan to meet with them again in 3 weeks to further discuss these issues and schedule surgery if he desires.  Plan: Pulmonary function testing.  Return in 3 weeks

## 2011-11-14 ENCOUNTER — Ambulatory Visit (INDEPENDENT_AMBULATORY_CARE_PROVIDER_SITE_OTHER): Payer: Medicare Other | Admitting: Thoracic Surgery (Cardiothoracic Vascular Surgery)

## 2011-11-14 ENCOUNTER — Encounter: Payer: Self-pay | Admitting: Thoracic Surgery (Cardiothoracic Vascular Surgery)

## 2011-11-14 ENCOUNTER — Ambulatory Visit (HOSPITAL_COMMUNITY)
Admission: RE | Admit: 2011-11-14 | Discharge: 2011-11-14 | Disposition: A | Discharge status: Home / Self Care | Payer: Medicare Other | Source: Ambulatory Visit | Attending: Thoracic Surgery (Cardiothoracic Vascular Surgery) | Admitting: Thoracic Surgery (Cardiothoracic Vascular Surgery)

## 2011-11-14 VITALS — BP 134/73 | HR 82 | Resp 18 | Ht 69.0 in | Wt 199.0 lb

## 2011-11-14 DIAGNOSIS — R222 Localized swelling, mass and lump, trunk: Secondary | ICD-10-CM

## 2011-11-14 DIAGNOSIS — J9859 Other diseases of mediastinum, not elsewhere classified: Secondary | ICD-10-CM

## 2011-11-14 DIAGNOSIS — R911 Solitary pulmonary nodule: Secondary | ICD-10-CM

## 2011-11-14 DIAGNOSIS — D381 Neoplasm of uncertain behavior of trachea, bronchus and lung: Secondary | ICD-10-CM

## 2011-11-14 NOTE — Progress Notes (Signed)
Patient ID: Leonard Lindsey, male   DOB: September 08, 1932, 78 y.o.   MRN: [id number] [name] and his wife return today to further discuss his lung nodule and mediastinal mass.  Saw him the office a couple weeks ago after he was found to have a 7 mm left lower lobe nodule and a 1.4 cm smooth rounded anterior mediastinal nodule. We had a long discussion at that time regarding our options which included an aggressive approach to go in surgically resect these lesions versus radiologic observation. I discussed the pros and cons of both approaches with them in detail at that time. His wife had to have some work done on a gastric pacemaker and they wish to wait until after that before making a final decision.  In the interim he did have pulmonary function testing. FEV1 was 3.48, 139% predicted  I once again reviewed with the patient and his wife the 2 lesions in question. We discussed the differential diagnosis as well as the radiographic appearance. I believe they're unrelated. He is a non-smoker but does have some chemical exposures that were occupational, including some exposure to asbestos. I once again discussed with them that it cannot be determined with certainty whether the lung nodule is benign or malignant. Given the small size the lesion I don't think a needle biopsy would be particularly helpful, particularly a negative result would not rule out possibility that this is cancerous.  His wife is very concerned about the potential for growth or spread if we follow this radiographically. The patient is very concerned about issues related to Plavix as relates to having surgical procedure done.  I explained in great detail that can certainly manage Plavix around the time of surgery, but that there is some risk of stroke given that he had a stroke only 6 months ago. I doubt however that waiting any additional period of time will decrease that stroke risk significantly. On the other hand there is very little  downside to following this radiographically and the intervening if there are signs of growth..  After much discussion decided to see him back in one month with a repeat CT of the chest to reassess these lesions and further discuss our options.

## 2011-11-21 ENCOUNTER — Ambulatory Visit: Payer: Medicare Other | Admitting: Thoracic Surgery (Cardiothoracic Vascular Surgery)

## 2011-11-22 ENCOUNTER — Encounter: Payer: Self-pay | Admitting: Thoracic Surgery (Cardiothoracic Vascular Surgery)

## 2011-11-22 ENCOUNTER — Ambulatory Visit (INDEPENDENT_AMBULATORY_CARE_PROVIDER_SITE_OTHER): Payer: Medicare Other | Admitting: Thoracic Surgery (Cardiothoracic Vascular Surgery)

## 2011-11-22 VITALS — BP 119/73 | HR 84 | Resp 20 | Ht 69.0 in | Wt 199.0 lb

## 2011-11-22 DIAGNOSIS — R911 Solitary pulmonary nodule: Secondary | ICD-10-CM

## 2011-11-22 DIAGNOSIS — R222 Localized swelling, mass and lump, trunk: Secondary | ICD-10-CM

## 2011-11-22 NOTE — Progress Notes (Signed)
Patient ID: Leonard Lindsey, male   DOB: 1932/11/12, 78 y.o.   MRN: [id number] I spent 30 minutes with Mr. and [name]  I had seen Mr. Hands last week to further discuss the to findings on his chest CT which are a 7 mm nodule in the left lower lobe, and a 14 mm smooth-walled anterior mediastinal nodule.  I spent a great deal of time with them mapping out our options regarding these lesions. The options I presented them with were 1) radiographic followup and 2) surgery. Surgery would involve left VATS, wedge resection of lung nodule, with possible segmentectomy or lobectomy depending on frozen section results, and resection of anterior mediastinal mass.  I discussed with the anterior mediastinal mass appeared to be benign, and was probably either a thymic cyst or a benign thymoma. I also discussed them that the lung nodules really too small to characterize and it could be cancerous or could be a benign inflammatory lesion.  Given that he's had strokes within the past 6 months and is on Plavix for that and felt like the down side 2 of early followup CT, it was agreed that we would see him back months from that visit with a repeat CT. If there was any sign of growth in the lung nodule we would recommend proceeding with surgery at that time.  In the interim he called back to schedule surgery, so I had him come back to the office today so that could make sure that we were on the same page regarding the plan.  I went back to all these issues with them again as they were somehow under the impression that surgery was the one and only option. After I discussed in detail the nature of the surgery, need for general anesthesia, expected hospital stay and overall recovery. I also discussed in detail the risks which include but are not limited to death, stroke, MI, DVT, PE, bleeding, possible need for transfusion, infections, air leaks, chronic pain.  The projected OR date was going to be Monday, July 1, which is  only a week before he would be due to have his 2 month followup CT.  After a long discussion he and his wife are comfortable with a repeat CT of the chest in 3 weeks, that'll be 2 months from his first CT scan, and wished to delay surgery until after we have seen and reviewed the CT scan.

## 2011-12-04 ENCOUNTER — Other Ambulatory Visit: Payer: Self-pay | Admitting: Thoracic Surgery (Cardiothoracic Vascular Surgery)

## 2011-12-04 DIAGNOSIS — R222 Localized swelling, mass and lump, trunk: Secondary | ICD-10-CM

## 2011-12-04 DIAGNOSIS — D381 Neoplasm of uncertain behavior of trachea, bronchus and lung: Secondary | ICD-10-CM

## 2011-12-19 ENCOUNTER — Encounter: Payer: Self-pay | Admitting: Thoracic Surgery (Cardiothoracic Vascular Surgery)

## 2011-12-19 ENCOUNTER — Ambulatory Visit
Admission: RE | Admit: 2011-12-19 | Discharge: 2011-12-19 | Disposition: A | Discharge status: Home / Self Care | Payer: Medicare Other | Source: Ambulatory Visit | Attending: Thoracic Surgery (Cardiothoracic Vascular Surgery) | Admitting: Thoracic Surgery (Cardiothoracic Vascular Surgery)

## 2011-12-19 ENCOUNTER — Ambulatory Visit (INDEPENDENT_AMBULATORY_CARE_PROVIDER_SITE_OTHER): Payer: Medicare Other | Admitting: Thoracic Surgery (Cardiothoracic Vascular Surgery)

## 2011-12-19 VITALS — BP 133/75 | HR 77 | Resp 16 | Ht 69.0 in | Wt 216.0 lb

## 2011-12-19 DIAGNOSIS — R911 Solitary pulmonary nodule: Secondary | ICD-10-CM

## 2011-12-19 DIAGNOSIS — R222 Localized swelling, mass and lump, trunk: Secondary | ICD-10-CM

## 2011-12-19 DIAGNOSIS — J9859 Other diseases of mediastinum, not elsewhere classified: Secondary | ICD-10-CM

## 2011-12-19 DIAGNOSIS — D381 Neoplasm of uncertain behavior of trachea, bronchus and lung: Secondary | ICD-10-CM

## 2011-12-19 NOTE — Progress Notes (Signed)
HPI:  [name] returns today with a repeat CT of the chest. He had a CT of the abdomen done in April which showed a 9 mm "spiculated nodule". A CT of the chest followed and showed a 10 x 14 mm smooth anterior mediastinal nodule in addition to the lung nodule. I recommended serial radiographic followup. They had some difficulty excepting that initially but did return today with a repeat scan to further discuss these lesions.   He says that he's been feeling well in the interim with the exception of his arthritis, which is a chronic problem for him. He's not had a new stroke or TIA-type symptoms. He denies cough or hemoptysis, fevers, chills or sweats.  Past Medical History  Diagnosis Date  . Essential hypertension, benign   . Hypothyroid     Following remote treatment of hyperthyroidism with radioactive iodine  . Mixed hyperlipidemia   . Prostatism   . Carotid artery disease     40-59% LICA 1/12  . Atrial septal aneurysm     TEE 11/12 with positive buble study - no definite ASD  . Stroke     Prior history and more recent - likely embolic in rright occipital parietal lobe 11/12      Current Outpatient Prescriptions  Medication Sig Dispense Refill  . atorvastatin (LIPITOR) 40 MG tablet Take 40 mg by mouth at bedtime.        . clopidogrel (PLAVIX) 75 MG tablet Take 1 tablet (75 mg total) by mouth daily with breakfast.  30 tablet  0  . cyanocobalamin (,VITAMIN B-12,) 1000 MCG/ML injection Inject 1,000 mcg into the muscle every 30 (thirty) days.        . diclofenac sodium (VOLTAREN) 1 % GEL Apply topically. USES BID      . diltiazem (CARDIZEM CD) 180 MG 24 hr capsule Take 180 mg by mouth every morning.        . dutasteride (AVODART) 0.5 MG capsule Take 0.5 mg by mouth at bedtime.       Marland Kitchen levothyroxine (SYNTHROID, LEVOTHROID) 137 MCG tablet Take 137 mcg by mouth every morning.       . ramipril (ALTACE) 1.25 MG capsule Take 5 mg by mouth every morning.       . Tamsulosin HCl (FLOMAX) 0.4  MG CAPS Take 0.4 mg by mouth at bedtime.       . triamterene-hydrochlorothiazide (DYAZIDE) 37.5-25 MG per capsule Take 1 capsule by mouth every morning.          Physical Exam BP 133/75  Pulse 77  Resp 16  Ht 5\' 9"  (1.753 m)  Wt 216 lb (97.977 kg)  BMI 31.90 kg/m2  SpO2 95% Well-appearing 78 year old male in no acute distress Exam otherwise unchanged from last visit  Diagnostic Tests: CT Chest 12/19/11 Comparison: Chest CT 10/13/2011 and abdominal CT 10/06/2011.  Findings: The previously demonstrated irregular nodular density in  the left lower lobe has basically resolved. There is minimal  residual ground-glass density in this area on images 48 and 49. No  suspicious pulmonary nodules are present. There is stable nodular  thickening along the minor fissure on image 31.  The soft tissue nodule in the prevascular space is unchanged,  measuring 1.4 x 1.0 cm on image 17. No other enlarged mediastinal  lymph nodes are identified. There are calcified left hilar lymph  nodes. There is a calcified granuloma at the left lung base. No  pleural or pericardial effusion is present. There are mild to  moderate coronary artery calcifications.  Images through the upper abdomen demonstrate hepatic steatosis.  There is no adrenal mass.  IMPRESSION:  1. Resolution of left lower lobe nodule consistent with a resolved  inflammatory process.  2. Unchanged prevascular soft tissue nodule from prior examination  of 2 months ago. This could reflect a lymph node or thymic lesion.  Given the stability over 2 months, continued follow-up is suggested  at 6 months.  3. No other adenopathy or acute findings seen.  Impression: 78 year old male with a questionable left lower lobe lung nodule. This has subsequently resolved consistent with an area of focal infection or inflammation. I reviewed the CT scans with the patient and his wife they could see these changes themselves.  He also has an incidentally noted  1.0 x 1.4 cm smooth round anterior mediastinal mass. 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 I recommended continued radiographic followup for that with a repeat CT of the chest in 6 months.  Plan: Return in 6 months with a CT of the chest to followup anterior mediastinal nodule.

## 2011-12-22 ENCOUNTER — Other Ambulatory Visit (HOSPITAL_COMMUNITY): Payer: Self-pay | Admitting: Family Medicine

## 2011-12-22 ENCOUNTER — Ambulatory Visit (HOSPITAL_COMMUNITY)
Admission: RE | Admit: 2011-12-22 | Discharge: 2011-12-22 | Disposition: A | Discharge status: Home / Self Care | Payer: Medicare Other | Source: Ambulatory Visit | Attending: Family Medicine | Admitting: Family Medicine

## 2011-12-22 DIAGNOSIS — M549 Dorsalgia, unspecified: Secondary | ICD-10-CM | POA: Insufficient documentation

## 2011-12-22 DIAGNOSIS — M4120 Other idiopathic scoliosis, site unspecified: Secondary | ICD-10-CM | POA: Insufficient documentation

## 2011-12-22 DIAGNOSIS — M5137 Other intervertebral disc degeneration, lumbosacral region: Secondary | ICD-10-CM | POA: Insufficient documentation

## 2011-12-22 DIAGNOSIS — M51379 Other intervertebral disc degeneration, lumbosacral region without mention of lumbar back pain or lower extremity pain: Secondary | ICD-10-CM | POA: Insufficient documentation

## 2012-06-06 ENCOUNTER — Other Ambulatory Visit: Payer: Self-pay | Admitting: *Deleted

## 2012-06-06 DIAGNOSIS — R911 Solitary pulmonary nodule: Secondary | ICD-10-CM

## 2012-06-06 DIAGNOSIS — J9859 Other diseases of mediastinum, not elsewhere classified: Secondary | ICD-10-CM

## 2012-06-25 ENCOUNTER — Ambulatory Visit (INDEPENDENT_AMBULATORY_CARE_PROVIDER_SITE_OTHER): Payer: Medicare Other | Admitting: Thoracic Surgery (Cardiothoracic Vascular Surgery)

## 2012-06-25 ENCOUNTER — Encounter: Payer: Self-pay | Admitting: Thoracic Surgery (Cardiothoracic Vascular Surgery)

## 2012-06-25 ENCOUNTER — Ambulatory Visit: Payer: Medicare Other | Admitting: Thoracic Surgery (Cardiothoracic Vascular Surgery)

## 2012-06-25 ENCOUNTER — Ambulatory Visit
Admission: RE | Admit: 2012-06-25 | Discharge: 2012-06-25 | Disposition: A | Discharge status: Home / Self Care | Payer: Medicare Other | Source: Ambulatory Visit | Attending: Thoracic Surgery (Cardiothoracic Vascular Surgery) | Admitting: Thoracic Surgery (Cardiothoracic Vascular Surgery)

## 2012-06-25 ENCOUNTER — Other Ambulatory Visit: Payer: Self-pay | Admitting: Thoracic Surgery (Cardiothoracic Vascular Surgery)

## 2012-06-25 VITALS — BP 136/74 | HR 73 | Resp 20 | Ht 69.0 in | Wt 216.0 lb

## 2012-06-25 DIAGNOSIS — R911 Solitary pulmonary nodule: Secondary | ICD-10-CM

## 2012-06-25 DIAGNOSIS — R222 Localized swelling, mass and lump, trunk: Secondary | ICD-10-CM

## 2012-06-25 DIAGNOSIS — J9859 Other diseases of mediastinum, not elsewhere classified: Secondary | ICD-10-CM

## 2012-06-25 LAB — BUN: BUN: 18 mg/dL (ref 6–23)

## 2012-06-25 MED ORDER — IOHEXOL 300 MG/ML  SOLN
75.0000 mL | Freq: Once | INTRAMUSCULAR | Status: AC | PRN
  Administered 2012-06-25: 75 mL via INTRAVENOUS

## 2012-06-25 NOTE — Progress Notes (Signed)
HPI:  Leonard Lindsey returns for a scheduled 6 month followup visit. He was found back in May CT scan to have a groundglass opacity in his left lower lobe as well as a small, smooth, rounded anterior mediastinal mass. We did a repeat CT couple of months later in the lung lesion had resolved and the anterior mediastinal lesion was stable. He now returns for a 6 month followup scan.  He says that in the interim since his last visit he's been doing well. He has not had any new medical problems. He is not having any difficulty with his breathing. He denies chest pain, pressure, tightness, cough, hemoptysis, shortness of breath. Past Medical History  Diagnosis Date  . Essential hypertension, benign   . Hypothyroid     Following remote treatment of hyperthyroidism with radioactive iodine  . Mixed hyperlipidemia   . Prostatism   . Carotid artery disease     40-59% LICA 1/12  . Atrial septal aneurysm     TEE 11/12 with positive buble study - no definite ASD  . Stroke     Prior history and more recent - likely embolic in rright occipital parietal lobe 11/12       Current Outpatient Prescriptions  Medication Sig Dispense Refill  . atorvastatin (LIPITOR) 40 MG tablet Take 40 mg by mouth at bedtime.        . clopidogrel (PLAVIX) 75 MG tablet Take 75 mg by mouth daily.      . cyanocobalamin (,VITAMIN B-12,) 1000 MCG/ML injection Inject 1,000 mcg into the muscle every 30 (thirty) days.        . diclofenac sodium (VOLTAREN) 1 % GEL Apply topically. USES BID      . diltiazem (CARDIZEM CD) 180 MG 24 hr capsule Take 180 mg by mouth every morning.        . dutasteride (AVODART) 0.5 MG capsule Take 0.5 mg by mouth at bedtime.       Marland Kitchen levothyroxine (SYNTHROID, LEVOTHROID) 137 MCG tablet Take 137 mcg by mouth every morning.       . ramipril (ALTACE) 1.25 MG capsule Take 5 mg by mouth every morning.       . Tamsulosin HCl (FLOMAX) 0.4 MG CAPS Take 0.4 mg by mouth at bedtime.       .  triamterene-hydrochlorothiazide (DYAZIDE) 37.5-25 MG per capsule Take 1 capsule by mouth every morning.          Physical Exam BP 136/74  Pulse 73  Resp 20  Ht 5\' 9"  (1.753 m)  Wt 216 lb (97.977 kg)  BMI 31.90 kg/m2  SpO71 30% 79 year old male in no acute distress Of the well-nourished Neck no palpable cervical or supraclavicular adenopathy Cardiac regular rate and rhythm normal S1 and S2, 2/6 systolic murmur Lungs clear with equal breath sounds bilaterally  Diagnostic Tests: CT of chest 06/25/2012 *RADIOLOGY REPORT*  Clinical Data: Follow-up anterior mediastinal mass.  CT CHEST WITH CONTRAST  Technique: Multidetector CT imaging of the chest was performed  following the standard protocol during bolus administration of  intravenous contrast.  Contrast: 75mL OMNIPAQUE IOHEXOL 300 MG/ML SOLN  Comparison: 12/19/2011  Findings: Small node or nodule in the anterior mediastinum is again  noted. This measures up to 14 mm and is stable. Scattered  coronary artery calcifications, best visualized in the left  anterior descending artery. Heart is normal size. Aorta is normal  caliber.  No hilar or axillary adenopathy. Calcified granuloma at the left  lung base, stable. No suspicious  pulmonary nodules. No pleural  effusions or focal opacities.  Visualized thyroid and chest wall soft tissues unremarkable.  Imaging into the upper abdomen shows no acute findings.  IMPRESSION:  Stable 14 mm nodule in the anterior mediastinum/prevascular space.  Recommend continued follow up with repeat CT in 9-12 months.  Old granulomatous disease.  Coronary artery disease.   Impression: Leonard Lindsey is a 79 year old gentleman with a stable small anterior mediastinal mass. This is about 1.4 cm in maximal diameter and is smooth and rounded and is unchanged over the last 8 months. 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 I recommended to him that we repeat a scan in 1 year to make sure that there is no change in the  interim.   Plan: Return in one year with CT of chest

## 2012-07-31 ENCOUNTER — Emergency Department (HOSPITAL_COMMUNITY): Payer: Medicare Other

## 2012-07-31 ENCOUNTER — Emergency Department (HOSPITAL_COMMUNITY)
Admission: EM | Admit: 2012-07-31 | Discharge: 2012-07-31 | Disposition: A | Discharge status: Home / Self Care | Payer: Medicare Other | Attending: Emergency Medicine | Admitting: Emergency Medicine

## 2012-07-31 ENCOUNTER — Encounter (HOSPITAL_COMMUNITY): Payer: Self-pay | Admitting: *Deleted

## 2012-07-31 DIAGNOSIS — I2510 Atherosclerotic heart disease of native coronary artery without angina pectoris: Secondary | ICD-10-CM | POA: Insufficient documentation

## 2012-07-31 DIAGNOSIS — G479 Sleep disorder, unspecified: Secondary | ICD-10-CM | POA: Insufficient documentation

## 2012-07-31 DIAGNOSIS — E782 Mixed hyperlipidemia: Secondary | ICD-10-CM | POA: Insufficient documentation

## 2012-07-31 DIAGNOSIS — I10 Essential (primary) hypertension: Secondary | ICD-10-CM | POA: Insufficient documentation

## 2012-07-31 DIAGNOSIS — M1710 Unilateral primary osteoarthritis, unspecified knee: Secondary | ICD-10-CM | POA: Insufficient documentation

## 2012-07-31 DIAGNOSIS — Z8679 Personal history of other diseases of the circulatory system: Secondary | ICD-10-CM | POA: Insufficient documentation

## 2012-07-31 DIAGNOSIS — Z79899 Other long term (current) drug therapy: Secondary | ICD-10-CM | POA: Insufficient documentation

## 2012-07-31 DIAGNOSIS — F29 Unspecified psychosis not due to a substance or known physiological condition: Secondary | ICD-10-CM | POA: Insufficient documentation

## 2012-07-31 DIAGNOSIS — Z87448 Personal history of other diseases of urinary system: Secondary | ICD-10-CM | POA: Insufficient documentation

## 2012-07-31 DIAGNOSIS — E039 Hypothyroidism, unspecified: Secondary | ICD-10-CM | POA: Insufficient documentation

## 2012-07-31 DIAGNOSIS — Z8673 Personal history of transient ischemic attack (TIA), and cerebral infarction without residual deficits: Secondary | ICD-10-CM | POA: Insufficient documentation

## 2012-07-31 DIAGNOSIS — R42 Dizziness and giddiness: Secondary | ICD-10-CM | POA: Insufficient documentation

## 2012-07-31 DIAGNOSIS — R4182 Altered mental status, unspecified: Secondary | ICD-10-CM | POA: Insufficient documentation

## 2012-07-31 DIAGNOSIS — M1610 Unilateral primary osteoarthritis, unspecified hip: Secondary | ICD-10-CM | POA: Insufficient documentation

## 2012-07-31 LAB — COMPREHENSIVE METABOLIC PANEL
Alkaline Phosphatase: 62 U/L (ref 39–117)
BUN: 22 mg/dL (ref 6–23)
GFR calc Af Amer: 70 mL/min — ABNORMAL LOW (ref >90)
Glucose, Bld: 108 mg/dL — ABNORMAL HIGH (ref 70–99)
Potassium: 4.1 mEq/L (ref 3.5–5.1)
Total Bilirubin: 0.8 mg/dL (ref 0.3–1.2)
Total Protein: 6.8 g/dL (ref 6.0–8.3)

## 2012-07-31 LAB — URINALYSIS, ROUTINE W REFLEX MICROSCOPIC
Ketones, ur: NEGATIVE mg/dL
Leukocytes, UA: NEGATIVE
Nitrite: NEGATIVE
Protein, ur: NEGATIVE mg/dL

## 2012-07-31 LAB — RAPID URINE DRUG SCREEN, HOSP PERFORMED: Barbiturates: NOT DETECTED

## 2012-07-31 LAB — CBC WITH DIFFERENTIAL/PLATELET
Eosinophils Absolute: 0 10*3/uL (ref 0.0–0.7)
HCT: 46.4 % (ref 39.0–52.0)
Hemoglobin: 16.1 g/dL (ref 13.0–17.0)
Lymphs Abs: 1.2 10*3/uL (ref 0.7–4.0)
MCH: 30.8 pg (ref 26.0–34.0)
MCV: 88.9 fL (ref 78.0–100.0)
Monocytes Absolute: 0.7 10*3/uL (ref 0.1–1.0)
Monocytes Relative: 6 % (ref 3–12)
Neutrophils Relative %: 82 % — ABNORMAL HIGH (ref 43–77)
RBC: 5.22 MIL/uL (ref 4.22–5.81)

## 2012-07-31 LAB — ACETAMINOPHEN LEVEL: Acetaminophen (Tylenol), Serum: <15 ug/mL (ref 10–30)

## 2012-07-31 NOTE — ED Notes (Signed)
Pt reporting nausea and overall weakness today.  States that at this time it is somewhat improved.  Pt believes symptoms from increase in Ultram.  Pt also on plavix, and has history of CVA.  Explained rationale of doing head CT and lab work.  Pt expressing desire to go home, but agreeable to tests at this time.

## 2012-07-31 NOTE — ED Notes (Signed)
Patient transported to CT 

## 2012-07-31 NOTE — ED Notes (Signed)
Pt states he vomited x 1 at 1700. States he thinks it may be due to his tramadol being increased from 50mg  -200mg  two weeks ago. States dizziness x 1 week.

## 2012-07-31 NOTE — ED Provider Notes (Signed)
History     CSN: [id number]  Arrival date & time 07/31/12  1734   First MD Initiated Contact with Patient 07/31/12 1917      Chief Complaint  Patient presents with  . Emesis  . Dizziness    (Consider location/radiation/quality/duration/timing/severity/associated sxs/prior treatment) HPI Comments: Patient is a 79 year old man who has had prior strokes. He also has severe arthritis in his knees and back. He previously been prescribed tramadol 200 mg extended release. Since then, he is seemed to become increasingly confused, and today felt sick. He had an episode of vomiting and now is feeling a bit better. However the wife notes that he seems confused, doesn't respond to her suggestions unless it is repeated several times. She thinks that this relates to his increased dose of tramadol.  Patient is a 79 y.o. male presenting with altered mental status. The history is provided by the patient, the spouse and medical records. No language interpreter was used.  Altered Mental Status This is a new problem. The current episode started more than 1 week ago. The problem occurs constantly. The problem has been gradually worsening. Pertinent negatives include no chest pain, no abdominal pain, no headaches and no shortness of breath. Nothing aggravates the symptoms. Relieved by: He had vomiting this evening and felt better. He has tried nothing for the symptoms.    Past Medical History  Diagnosis Date  . Essential hypertension, benign   . Hypothyroid     Following remote treatment of hyperthyroidism with radioactive iodine  . Mixed hyperlipidemia   . Prostatism   . Carotid artery disease     40-59% LICA 1/12  . Atrial septal aneurysm     TEE 11/12 with positive buble study - no definite ASD  . Stroke     Prior history and more recent - likely embolic in rright occipital parietal lobe 11/12    Past Surgical History  Procedure Laterality Date  . Abdominal surgery      Tumor removal  . Tee  without cardioversion  05/09/2011    Procedure: TRANSESOPHAGEAL ECHOCARDIOGRAM (TEE);  Surgeon: Lewayne Bunting, MD;  Location: [facility] ENDOSCOPY;  Service: Cardiovascular;  Laterality: N/A;    Family History  Problem Relation Age of Onset  . Anesthesia problems Neg Hx   . Hypotension Neg Hx   . Malignant hyperthermia Neg Hx   . Pseudochol deficiency Neg Hx     History  Substance Use Topics  . Smoking status: Never Smoker   . Smokeless tobacco: Never Used  . Alcohol Use: No      Review of Systems  Constitutional: Negative.  Negative for fever and chills.  HENT: Negative.   Eyes: Negative.   Respiratory: Negative.  Negative for shortness of breath.   Cardiovascular: Negative for chest pain.  Gastrointestinal: Positive for nausea and vomiting. Negative for abdominal pain and diarrhea.  Genitourinary: Negative.   Musculoskeletal: Negative.   Skin: Negative.   Neurological: Positive for light-headedness. Negative for headaches.  Psychiatric/Behavioral: Positive for confusion, sleep disturbance and altered mental status.    Allergies  Review of patient's allergies indicates no known allergies.  Home Medications   Current Outpatient Rx  Name  Route  Sig  Dispense  Refill  . atorvastatin (LIPITOR) 40 MG tablet   Oral   Take 40 mg by mouth at bedtime.           . clopidogrel (PLAVIX) 75 MG tablet   Oral   Take 75 mg by mouth daily.         Marland Kitchen  cyanocobalamin (,VITAMIN B-12,) 1000 MCG/ML injection   Intramuscular   Inject 1,000 mcg into the muscle every 30 (thirty) days.           . diclofenac sodium (VOLTAREN) 1 % GEL   Topical   Apply topically. USES BID         . diltiazem (CARDIZEM CD) 180 MG 24 hr capsule   Oral   Take 180 mg by mouth every morning.           . dutasteride (AVODART) 0.5 MG capsule   Oral   Take 0.5 mg by mouth at bedtime.          Marland Kitchen levothyroxine (SYNTHROID, LEVOTHROID) 137 MCG tablet   Oral   Take 137 mcg by mouth every morning.           . ramipril (ALTACE) 1.25 MG capsule   Oral   Take 5 mg by mouth every morning.          . Tamsulosin HCl (FLOMAX) 0.4 MG CAPS   Oral   Take 0.4 mg by mouth at bedtime.          . triamterene-hydrochlorothiazide (DYAZIDE) 37.5-25 MG per capsule   Oral   Take 1 capsule by mouth every morning.             BP 136/84  Pulse 64  Temp(Src) 97.4 F (36.3 C) (Oral)  Resp 18  Ht 5' 9.5" (1.765 m)  Wt 210 lb (95.255 kg)  BMI 30.58 kg/m2  SpO2 93%  Physical Exam  Nursing note and vitals reviewed. Constitutional: He is oriented to person, place, and time. He appears well-developed and well-nourished. No distress.  HENT:  Head: Normocephalic and atraumatic.  Right Ear: External ear normal.  Left Ear: External ear normal.  Mouth/Throat: Oropharynx is clear and moist.  Eyes: Conjunctivae and EOM are normal. Pupils are equal, round, and reactive to light.  Neck: Normal range of motion. Neck supple.  Cardiovascular: Normal rate, regular rhythm and normal heart sounds.   Pulmonary/Chest: Effort normal and breath sounds normal.  Abdominal: Soft. Bowel sounds are normal.  Musculoskeletal:  Patient's right knee is in a brace.  Neurological: He is alert and oriented to person, place, and time.  No sensory or motor deficit. Not currently confused.  Skin: Skin is warm and dry.  Psychiatric: He has a normal mood and affect. His behavior is normal.    ED Course  Procedures (including critical care time)  Labs Reviewed  CBC WITH DIFFERENTIAL  COMPREHENSIVE METABOLIC PANEL  URINALYSIS, ROUTINE W REFLEX MICROSCOPIC  ETHANOL  URINE RAPID DRUG SCREEN (HOSP PERFORMED)  ACETAMINOPHEN LEVEL  SALICYLATE LEVEL   7:35 PM Pt was seen and had physical examination.  Lab tests for serious causes of altered mental status were ordered.   Date: 07/31/2012  Rate: 66  Rhythm: normal sinus rhythm  QRS Axis: normal  Intervals: PR prolonged  ST/T Wave abnormalities: normal   Conduction Disutrbances:first-degree A-V block   Narrative Interpretation: Sinus rhythm with first degree AV Block.  Old EKG Reviewed: unchanged  Results for orders placed during the hospital encounter of 07/31/12  CBC WITH DIFFERENTIAL      Result Value Range   WBC 10.3  4.0 - 10.5 K/uL   RBC 5.22  4.22 - 5.81 MIL/uL   Hemoglobin 16.1  13.0 - 17.0 g/dL   HCT 16.1  09.6 - 04.5 %   MCV 88.9  78.0 - 100.0 fL   MCH 30.8  26.0 -  34.0 pg   MCHC 34.7  30.0 - 36.0 g/dL   RDW 16.1  09.6 - 04.5 %   Platelets 189  150 - 400 K/uL   Neutrophils Relative 82 (*) 43 - 77 %   Neutro Abs 8.4 (*) 1.7 - 7.7 K/uL   Lymphocytes Relative 12  12 - 46 %   Lymphs Abs 1.2  0.7 - 4.0 K/uL   Monocytes Relative 6  3 - 12 %   Monocytes Absolute 0.7  0.1 - 1.0 K/uL   Eosinophils Relative 0  0 - 5 %   Eosinophils Absolute 0.0  0.0 - 0.7 K/uL   Basophils Relative 0  0 - 1 %   Basophils Absolute 0.0  0.0 - 0.1 K/uL  COMPREHENSIVE METABOLIC PANEL      Result Value Range   Sodium 132 (*) 135 - 145 mEq/L   Potassium 4.1  3.5 - 5.1 mEq/L   Chloride 96  96 - 112 mEq/L   CO2 24  19 - 32 mEq/L   Glucose, Bld 108 (*) 70 - 99 mg/dL   BUN 22  6 - 23 mg/dL   Creatinine, Ser 4.09  0.50 - 1.35 mg/dL   Calcium 9.2  8.4 - 81.1 mg/dL   Total Protein 6.8  6.0 - 8.3 g/dL   Albumin 4.0  3.5 - 5.2 g/dL   AST 21  0 - 37 U/L   ALT 19  0 - 53 U/L   Alkaline Phosphatase 62  39 - 117 U/L   Total Bilirubin 0.8  0.3 - 1.2 mg/dL   GFR calc non Af Amer 60 (*) >90 mL/min   GFR calc Af Amer 70 (*) >90 mL/min  URINALYSIS, ROUTINE W REFLEX MICROSCOPIC      Result Value Range   Color, Urine YELLOW  YELLOW   APPearance CLEAR  CLEAR   Specific Gravity, Urine 1.025  1.005 - 1.030   pH 6.0  5.0 - 8.0   Glucose, UA NEGATIVE  NEGATIVE mg/dL   Hgb urine dipstick NEGATIVE  NEGATIVE   Bilirubin Urine NEGATIVE  NEGATIVE   Ketones, ur NEGATIVE  NEGATIVE mg/dL   Protein, ur NEGATIVE  NEGATIVE mg/dL   Urobilinogen, UA 0.2  0.0 - 1.0 mg/dL    Nitrite NEGATIVE  NEGATIVE   Leukocytes, UA NEGATIVE  NEGATIVE  ETHANOL      Result Value Range   Alcohol, Ethyl (B) <11  0 - 11 mg/dL  URINE RAPID DRUG SCREEN (HOSP PERFORMED)      Result Value Range   Opiates NONE DETECTED  NONE DETECTED   Cocaine NONE DETECTED  NONE DETECTED   Benzodiazepines NONE DETECTED  NONE DETECTED   Amphetamines NONE DETECTED  NONE DETECTED   Tetrahydrocannabinol NONE DETECTED  NONE DETECTED   Barbiturates NONE DETECTED  NONE DETECTED  ACETAMINOPHEN LEVEL      Result Value Range   Acetaminophen (Tylenol), Serum <15.0  10 - 30 ug/mL  SALICYLATE LEVEL      Result Value Range   Salicylate Lvl <2.0 (*) 2.8 - 20.0 mg/dL   Ct Head Wo Contrast  07/31/2012  *RADIOLOGY REPORT*  Clinical Data: Altered mental status.  CT HEAD WITHOUT CONTRAST  Technique:  Contiguous axial images were obtained from the base of the skull through the vertex without contrast.  Comparison: 05/07/2011.  Findings: Stable age related cerebral atrophy, ventriculomegaly and periventricular white matter disease.  No extra-axial fluid collections are identified.  No CT findings for acute hemispheric infarction  or intracranial hemorrhage.  No mass lesions.  The brainstem and cerebellum grossly normal and stable.  The bony structures are intact.  The paranasal sinuses and mastoid air cells are grossly clear.  IMPRESSION:  1.  Stable age related cerebral atrophy, ventriculomegaly and periventricular white matter disease. 2.  No acute intracranial findings.   Original Report Authenticated By: Rudie Meyer, M.D.     Lab workup essentially negative.  I advised pt and his wife that he should stop tramadol.  He should see Dr. Renard Matter tomorrow to get a different pain medicine for his chronic knee and back pain.   1. Altered mental status         Carleene Cooper III, MD 08/01/12 1249

## 2012-09-10 ENCOUNTER — Ambulatory Visit (HOSPITAL_COMMUNITY)
Admission: RE | Admit: 2012-09-10 | Discharge: 2012-09-10 | Disposition: A | Discharge status: Home / Self Care | Payer: Medicare Other | Source: Ambulatory Visit | Attending: Orthopedic Surgery | Admitting: Orthopedic Surgery

## 2012-09-10 ENCOUNTER — Other Ambulatory Visit: Payer: Self-pay | Admitting: Orthopedic Surgery

## 2012-09-10 DIAGNOSIS — M25531 Pain in right wrist: Secondary | ICD-10-CM

## 2012-09-10 DIAGNOSIS — M25539 Pain in unspecified wrist: Secondary | ICD-10-CM | POA: Insufficient documentation

## 2012-09-12 ENCOUNTER — Encounter: Payer: Self-pay | Admitting: Orthopedic Surgery

## 2012-09-12 ENCOUNTER — Ambulatory Visit (INDEPENDENT_AMBULATORY_CARE_PROVIDER_SITE_OTHER): Payer: Medicare Other | Admitting: Orthopedic Surgery

## 2012-09-12 VITALS — BP 110/60

## 2012-09-12 DIAGNOSIS — M654 Radial styloid tenosynovitis [de Quervain]: Secondary | ICD-10-CM | POA: Insufficient documentation

## 2012-09-12 MED ORDER — DICLOFENAC SODIUM 1 % TD GEL: 2.0000 g | Freq: Four times a day (QID) | TRANSDERMAL | Status: DC

## 2012-09-12 NOTE — Progress Notes (Signed)
Patient ID: Leonard Lindsey, male   DOB: 11/26/1932, 79 y.o.   MRN: [id number] Chief Complaint  Patient presents with  . Wrist Problem    Dr. Renard Matter has referred for pain right wrist    History 79 year-old male with pain over the right wrist since 08/10/2012 no trauma came on suddenly complains of sharp burning 7/10 constant pain unrelieved by over-the-counter muscle creams and rubs. Worse with use nothing has made it better pain is constant. Swelling is noted over the first extensor compartment. He did also get an injection.  Review of systems frequency and urgency easy bleeding and bruising, takes Plavix  Excessive urination cold intolerance denies numbness tingling or shortness of breath  History carpal tunnel release  Medical problems thyroid disease. He listed no allergies. Has a family history of arthritis he is married he is retired he doesn't smoke or drink. He did not bring his medications he forgot them at home.  General appearance is normal, the patient is alert and oriented x3 with normal mood and affect. BP 110/60  Ambulation is relatively normal he wears a brace on his right knee for arthritis of his affected by that  His left wrist is swollen over the first extensor compartment has painful ulnar deviation is slight decrease in range of motion the wrist joint is stable his motor exam is normal he has a positive Finkelstein's test scans intact he has normal sensation pulse and perfusion of the hand are normal his lymph nodes are negative in the epitrochlear region  X-rays are benign report is included no acute abnormalities were noted no acute fracture dislocation or bone destruction  Impression De Quervain's disease (radial styloid tenosynovitis) - Plan: diclofenac sodium (VOLTAREN) 1 % GEL   Plan recommend Voltaren gel and splinting come back in 6 weeks if no improvement  Voltaren gel secondary to Plavix.

## 2012-09-12 NOTE — Patient Instructions (Signed)
De Quervain's Disease  De Quervain's disease is a condition often seen in racquet sports where there is a soreness (inflammation) in the cord like structures (tendons) which attach muscle to bone on the thumb side of the wrist. There may be a tightening of the tissuesaround the tendons. This condition is often helped by giving up or modifying the activity which caused it. When conservative treatment does not help, surgery may be required. Conservative treatment could include changes in the activity which brought about the problem or made it worse. Anti-inflammatory medications and injections may be used to help decrease the inflammation and help with pain control. Your caregiver will help you determine which is best for you.  DIAGNOSIS   Often the diagnosis (learning what is wrong) can be made by examination. Sometimes x-rays are required.  HOME CARE INSTRUCTIONS    Apply ice to the sore area for 15 to 20 minutes, 3 to 4 times per day while awake. Put the ice in a plastic bag and place a towel between the bag of ice and your skin. This is especially helpful if it can be done after all activities involving the sore wrist.   Temporary splinting may help.   Only take over-the-counter or prescription medicines for pain, discomfort or fever as directed by your caregiver.  SEEK MEDICAL CARE IF:    Pain relief is not obtained with medications, or if you have increasing pain and seem to be getting worse rather than better.  MAKE SURE YOU:    Understand these instructions.   Will watch your condition.   Will get help right away if you are not doing well or get worse.  Document Released: 02/21/2001 Document Revised: 08/21/2011 Document Reviewed: 05/29/2005  [employer] Patient Information 2013 [employer].

## 2012-09-16 ENCOUNTER — Telehealth: Payer: Self-pay | Admitting: Orthopedic Surgery

## 2012-09-16 NOTE — Telephone Encounter (Signed)
Del Overfelt called for her husband, Westin.  She said the pain cream was not at [facility].  They have been checking  with the Pharmacy since last Thursday.   Adin' phone # [phone]

## 2012-09-16 NOTE — Telephone Encounter (Signed)
Called prescription in to [facility]

## 2012-10-04 ENCOUNTER — Telehealth: Payer: Self-pay | Admitting: Orthopedic Surgery

## 2012-10-04 NOTE — Telephone Encounter (Signed)
Patient cancelled upcoming follow up appointment for 10/24/12, re-check wrist,hand, due to leaving for out of state.  Offered re-schedule, however, patient elects to call back.  Patient and wife state that he continues to have pain, mainly worsening at night, and is taking the medication and wearing brace.  If any further recommendations, please advise.  PH #'s are [phone] [facility]) [phone] [facility]).

## 2012-10-24 ENCOUNTER — Ambulatory Visit: Payer: Medicare Other | Admitting: Orthopedic Surgery

## 2012-11-19 ENCOUNTER — Encounter: Payer: Self-pay | Admitting: Orthopedic Surgery

## 2012-11-19 ENCOUNTER — Ambulatory Visit (INDEPENDENT_AMBULATORY_CARE_PROVIDER_SITE_OTHER): Payer: Medicare Other | Admitting: Orthopedic Surgery

## 2012-11-19 VITALS — BP 110/52 | Ht 69.5 in | Wt 210.0 lb

## 2012-11-19 DIAGNOSIS — M654 Radial styloid tenosynovitis [de Quervain]: Secondary | ICD-10-CM

## 2012-11-19 NOTE — Patient Instructions (Addendum)
Activity as tolerated

## 2012-11-19 NOTE — Progress Notes (Signed)
Patient ID: Leonard Lindsey, male   DOB: 1933/01/12, 79 y.o.   MRN: [id number] Chief Complaint  Patient presents with  . Follow-up    6 week recheck right wrist Suzette Battiest     De Quervain's syndrome treated with bracing and rest. Patient took the brace off when the pain stopped and he is able to do his activities in her yard without any difficulty  Finkelstein's test is negative today. No swelling.  Followup as needed activities as tolerated

## 2012-12-17 ENCOUNTER — Ambulatory Visit (HOSPITAL_COMMUNITY)
Admission: RE | Admit: 2012-12-17 | Discharge: 2012-12-17 | Disposition: A | Discharge status: Home / Self Care | Payer: Medicare Other | Source: Ambulatory Visit | Attending: Family Medicine | Admitting: Family Medicine

## 2012-12-17 ENCOUNTER — Other Ambulatory Visit (HOSPITAL_COMMUNITY): Payer: Self-pay | Admitting: Family Medicine

## 2012-12-17 DIAGNOSIS — M25569 Pain in unspecified knee: Secondary | ICD-10-CM | POA: Insufficient documentation

## 2012-12-17 DIAGNOSIS — IMO0002 Reserved for concepts with insufficient information to code with codable children: Secondary | ICD-10-CM | POA: Insufficient documentation

## 2012-12-17 DIAGNOSIS — M1990 Unspecified osteoarthritis, unspecified site: Secondary | ICD-10-CM

## 2012-12-17 DIAGNOSIS — M1710 Unilateral primary osteoarthritis, unspecified knee: Secondary | ICD-10-CM | POA: Insufficient documentation

## 2013-02-17 ENCOUNTER — Ambulatory Visit: Payer: Medicare Other | Admitting: Cardiology

## 2013-02-28 ENCOUNTER — Ambulatory Visit (INDEPENDENT_AMBULATORY_CARE_PROVIDER_SITE_OTHER): Payer: Medicare Other | Admitting: Cardiology

## 2013-02-28 ENCOUNTER — Ambulatory Visit: Payer: Medicare Other | Admitting: Cardiology

## 2013-02-28 ENCOUNTER — Encounter: Payer: Self-pay | Admitting: Cardiology

## 2013-02-28 VITALS — BP 124/82 | HR 60 | Ht 69.5 in | Wt 194.0 lb

## 2013-02-28 DIAGNOSIS — I639 Cerebral infarction, unspecified: Secondary | ICD-10-CM

## 2013-02-28 DIAGNOSIS — I6350 Cerebral infarction due to unspecified occlusion or stenosis of unspecified cerebral artery: Secondary | ICD-10-CM

## 2013-02-28 NOTE — Patient Instructions (Addendum)
Your physician recommends that you schedule a follow-up appointment in: AS NEEDED  

## 2013-02-28 NOTE — Progress Notes (Signed)
Clinical Summary Mr. Neuser is a 79 y.o.male  1. CVA - presumed embolic Nov 2012 - TEE showed atrial septal aneurysm w/ + bubble study, but no definitive ASD or PFO. No history of afib . 12/12 7 day monitor showed no afib - echo w/ normal chamber sizes, normal LVEF. No orthopnea, no PND, no LE edema.   - patient referred for cardiac clearance for prostate surgery - denies any chest pain, no SOB, no DOE. Can walk over 3 miles a day without limitation. Can tolerate walking more than 3-4 flights of stairs without trouble   Past Medical History  Diagnosis Date  . Essential hypertension, benign   . Hypothyroid     Following remote treatment of hyperthyroidism with radioactive iodine  . Mixed hyperlipidemia   . Prostatism   . Carotid artery disease     40-59% LICA 1/12  . Atrial septal aneurysm     TEE 11/12 with positive buble study - no definite ASD  . Stroke     Prior history and more recent - likely embolic in rright occipital parietal lobe 11/12  Lung nodule, followed by CT surgery   No Known Allergies   Current Outpatient Prescriptions  Medication Sig Dispense Refill  . atorvastatin (LIPITOR) 40 MG tablet Take 40 mg by mouth at bedtime.       . clopidogrel (PLAVIX) 75 MG tablet Take 75 mg by mouth every morning.       . cyanocobalamin (,VITAMIN B-12,) 1000 MCG/ML injection Inject 1,000 mcg into the muscle every 30 (thirty) days.        . diclofenac sodium (VOLTAREN) 1 % GEL Apply 2 g topically 4 (four) times daily.  1 Tube  5  . diltiazem (CARTIA XT) 180 MG 24 hr capsule Take 180 mg by mouth every morning.      . dutasteride (AVODART) 0.5 MG capsule Take 0.5 mg by mouth at bedtime.       Marland Kitchen levothyroxine (SYNTHROID, LEVOTHROID) 137 MCG tablet Take 137 mcg by mouth every morning.       . lidocaine (LIDODERM) 5 % Place 1 patch onto the skin 2 (two) times daily as needed (for pain). Remove & Discard patch within 12 hours or as directed by MD      . ramipril (ALTACE) 5 MG  capsule Take 5 mg by mouth every morning.      . Tamsulosin HCl (FLOMAX) 0.4 MG CAPS Take 0.4 mg by mouth at bedtime.       . traMADol (ULTRAM) 50 MG tablet Take 50 mg by mouth daily as needed for pain.      Marland Kitchen triamterene-hydrochlorothiazide (DYAZIDE) 37.5-25 MG per capsule Take 1 capsule by mouth every morning.        No current facility-administered medications for this visit.     Past Surgical History  Procedure Laterality Date  . Abdominal surgery      Tumor removal  . Tee without cardioversion  05/09/2011    Procedure: TRANSESOPHAGEAL ECHOCARDIOGRAM (TEE);  Surgeon: Lewayne Bunting, MD;  Location: [facility] ENDOSCOPY;  Service: Cardiovascular;  Laterality: N/A;     No Known Allergies    Family History  Problem Relation Age of Onset  . Anesthesia problems Neg Hx   . Hypotension Neg Hx   . Malignant hyperthermia Neg Hx   . Pseudochol deficiency Neg Hx      Social History Mr. Burich reports that he has never smoked. He has never used smokeless tobacco. Mr. Matlock reports  that he does not drink alcohol.   Review of Systems 12 point ROS negative other than reported in HPI  Physical Examination p 60 bp 124/82 Wt 194 BMI 28 Gen: resting comfortably, NAD HEENT: no scleral icterus, pupils equal round and reactive, no palptable cervical adenopathy CV: RRR,  Pulm: CTAB Abd: soft, NT, ND NABS, no hepatosplenomegaly Ext: warm, no edema.  Skin: warm, no rash Neuro: A&Ox3, no focal deficits    Diagnostic Studies 04/2011 Echo: LVEF 55-60%, mild AI, mild MR, normal LA  04/2011 TEE: LVEF 55-60%, no intracardiac thrombus, atrial septum aneurysm w/ positive bubble study,     Assessment and Plan  1.CVA - prior CVA 2012, thought possibly cardioembolic however never confirmed. No evidence of afib on monitoring, no definitive shunt, no intracardiac thrombus on TEE. On plavix for secondary stroke prevention.  - continue secondary prevention measures  2. Preop evaluatoin - ok to  hold plavix for upcoming urological procedure, resume once ok from surgical standpoint. He is not on it for any cardiac condition, only for stroke prevention. - he has no acute active cardiac conditions. He tolerates greater than 4 METs on a regular basis without any significant limitation. He is being considered for a low risk procedure. Recommend proceed w/ planned procedure, do not recommend any further cardiac testing or intervention.    Antoine Poche, M.D., F.A.C.C.

## 2013-04-17 ENCOUNTER — Other Ambulatory Visit: Payer: Self-pay

## 2013-06-16 ENCOUNTER — Other Ambulatory Visit: Payer: Self-pay

## 2013-06-16 DIAGNOSIS — R222 Localized swelling, mass and lump, trunk: Secondary | ICD-10-CM

## 2013-07-08 ENCOUNTER — Encounter: Payer: Self-pay | Admitting: Thoracic Surgery (Cardiothoracic Vascular Surgery)

## 2013-07-08 ENCOUNTER — Ambulatory Visit
Admission: RE | Admit: 2013-07-08 | Discharge: 2013-07-08 | Disposition: A | Discharge status: Home / Self Care | Payer: Medicare Other | Source: Ambulatory Visit | Attending: Thoracic Surgery (Cardiothoracic Vascular Surgery) | Admitting: Thoracic Surgery (Cardiothoracic Vascular Surgery)

## 2013-07-08 ENCOUNTER — Ambulatory Visit (INDEPENDENT_AMBULATORY_CARE_PROVIDER_SITE_OTHER): Payer: Medicare Other | Admitting: Thoracic Surgery (Cardiothoracic Vascular Surgery)

## 2013-07-08 VITALS — BP 128/73 | HR 86 | Resp 20 | Ht 69.5 in | Wt 210.0 lb

## 2013-07-08 DIAGNOSIS — R222 Localized swelling, mass and lump, trunk: Secondary | ICD-10-CM

## 2013-07-08 DIAGNOSIS — J9859 Other diseases of mediastinum, not elsewhere classified: Secondary | ICD-10-CM

## 2013-07-08 NOTE — Progress Notes (Signed)
HPI:  Mr. Reisz is a 80 year old gentleman who we have been following in the office since May of 2013. He had a CT at that time which showed an 8 mm groundglass opacity in his lung and a 1.4 cm round anterior mediastinal mass. We ended up following those lesions with serial CT scans. The lung opacity resolved. The anterior mediastinal mass remained stable. He now returns for her one-year followup.  In the interim since his last visit he has undergone prostate surgery. Complaint of right knee pain. He is wearing a brace and thinks he needs surgery for that, but he has not seen anybody about surgery yet. He's not had any chest pain or tightness. He has not had any issues with his breathing.  Past Medical History  Diagnosis Date  . Essential hypertension, benign   . Hypothyroid     Following remote treatment of hyperthyroidism with radioactive iodine  . Mixed hyperlipidemia   . Prostatism   . Carotid artery disease     11-91% LICA 4/78  . Atrial septal aneurysm     TEE 11/12 with positive buble study - no definite ASD  . Stroke     Prior history and more recent - likely embolic in rright occipital parietal lobe 11/12      Current Outpatient Prescriptions  Medication Sig Dispense Refill  . atorvastatin (LIPITOR) 40 MG tablet Take 40 mg by mouth at bedtime.       . clopidogrel (PLAVIX) 75 MG tablet Take 75 mg by mouth every morning.       . cyanocobalamin (,VITAMIN B-12,) 1000 MCG/ML injection Inject 1,000 mcg into the muscle every 30 (thirty) days.        . diclofenac sodium (VOLTAREN) 1 % GEL Apply 2 g topically 4 (four) times daily.  1 Tube  5  . diltiazem (CARTIA XT) 180 MG 24 hr capsule Take 180 mg by mouth every morning.      . dutasteride (AVODART) 0.5 MG capsule Take 0.5 mg by mouth at bedtime.       Marland Kitchen levothyroxine (SYNTHROID, LEVOTHROID) 137 MCG tablet Take 137 mcg by mouth every morning.       . lidocaine (LIDODERM) 5 % Place 1 patch onto the skin 2 (two) times daily as  needed (for pain). Remove & Discard patch within 12 hours or as directed by MD      . ramipril (ALTACE) 5 MG capsule Take 5 mg by mouth every morning.      . Tamsulosin HCl (FLOMAX) 0.4 MG CAPS Take 0.4 mg by mouth at bedtime.       . traMADol (ULTRAM) 50 MG tablet Take 50 mg by mouth daily as needed for pain.      Marland Kitchen triamterene-hydrochlorothiazide (DYAZIDE) 37.5-25 MG per capsule Take 1 capsule by mouth every morning.        No current facility-administered medications for this visit.    Physical Exam BP 128/73  Pulse 86  Resp 20  Ht 5' 9.5" (1.765 m)  Wt 210 lb (95.255 kg)  BMI 30.58 kg/m2  SpO47 6% 80 year old male in no acute distress Well-developed well-nourished Alert and oriented x3 with no focal neurologic deficits Cardiac regular rate and rhythm normal S1 and S2 Lungs clear with equal breath sounds bilaterally No cervical or supraclavicular adenopathy  Diagnostic Tests: CT chest 07/08/2013 CLINICAL DATA: Follow-up anterior mediastinal mass  EXAM:  CT CHEST WITHOUT CONTRAST  TECHNIQUE:  Multidetector CT imaging of the chest was performed following the  standard protocol without IV contrast.  COMPARISON: Multiple priors, including 06/25/2012 and 10/13/2011  FINDINGS:  14 x 12 mm soft tissue nodule in the anterior mediastinum (series 3/  image 20), measuring fluid density and unchanged since 2013, benign.  Calcified granuloma at the left lung base (series 4/ image 56). 4 mm  subpleural nodule along the right minor fissure (series 4/image 34),  unchanged since 2013, benign. Mild centrilobular emphysematous  changes. No pleural effusion or pneumothorax.  The heart is normal in size. No pericardial effusion. Coronary  atherosclerosis. Mild atherosclerotic calcifications of the aortic  arch.  No suspicious mediastinal or axillary lymphadenopathy.  Visualized upper abdomen is unremarkable.  Degenerative changes of the visualized thoracolumbar spine.  IMPRESSION:  14 mm  soft tissue nodule in the anterior mediastinum, unchanged  since 2013, benign.  No evidence of acute cardiopulmonary disease.  Electronically Signed  By: Julian Hy M.D.  On: 07/08/2013 11:55  Impression: 80 year old gentleman with a 1.4 cm fluid density mass in the anterior mediastinum. This is unchanged over 2 years. 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 There is no need for continued followup.  Plan: Will followup with Dr. Everette Rank.  I will be happy to see him back in time for can be of any further assistance with his care.

## 2013-08-05 ENCOUNTER — Ambulatory Visit: Payer: Medicare Other | Admitting: Orthopedic Surgery

## 2013-08-19 ENCOUNTER — Encounter: Payer: Self-pay | Admitting: Orthopedic Surgery

## 2013-08-19 ENCOUNTER — Ambulatory Visit (INDEPENDENT_AMBULATORY_CARE_PROVIDER_SITE_OTHER): Payer: Medicare Other | Admitting: Orthopedic Surgery

## 2013-08-19 VITALS — BP 126/73 | Ht 69.5 in | Wt 217.0 lb

## 2013-08-19 DIAGNOSIS — IMO0002 Reserved for concepts with insufficient information to code with codable children: Secondary | ICD-10-CM

## 2013-08-19 DIAGNOSIS — M1710 Unilateral primary osteoarthritis, unspecified knee: Secondary | ICD-10-CM | POA: Insufficient documentation

## 2013-08-19 NOTE — Progress Notes (Signed)
Patient ID: Leonard Lindsey, male   DOB: 1932-06-20, 80 y.o.   MRN: [id number] Chief complaint sore right knee  This is a 80 year old male who presents for evaluation of his right knee. He's had pain for some time he describes as 6 months to a year but I suspect his been longer than that. Most of his pain is on the medial side of his knee it is partially relieved by a brace but is exacerbated by walking getting up and down from a chair and associated with swelling. He denies catching locking or giving way he said no other treatment  He seems to be unaware of his medical problems with a severity of them and they are as follows: Past Medical History  Diagnosis Date  . Essential hypertension, benign   . Hypothyroid     Following remote treatment of hyperthyroidism with radioactive iodine  . Mixed hyperlipidemia   . Prostatism   . Carotid artery disease     40-34% LICA 7/42  . Atrial septal aneurysm     TEE 11/12 with positive buble study - no definite ASD  . Stroke     Prior history and more recent - likely embolic in rright occipital parietal lobe 11/12   He's recently had a transurethral retrograde prostatectomy within the last 2 months  System review difficulty urination excessive urination other systems are negative denies chest pain or shortness of breath although not sure he is dressing himself enough to bring that about if it were present  No allergies  He is also had 2 strokes  He has some apparent coronary artery disease  Family history of arthritis  He is married his wife is with him his been retired for 12 years she does not smoke he does drink and he took some college courses as his highest level of education  Vital signs BP 126/73  Ht 5' 9.5" (1.765 m)  Wt 217 lb (98.431 kg)  BMI 31.60 kg/m2   General appearance: Development, nutrition are normal. Body habitus medium  No gross deformities are noted and grooming normal.  Peripheral vascular system no swelling or  varicose veins are noted and pulses are palpable without tenderness, temperature warm to touch no edema.  No palpable lymph nodes are noted in the cervical area or axillae.  Gait abnormalities osteoarthritic abnormalities which include the speed of gait which is abnormal and the length of gait per step Balance is normal  Neurologic exam shows no pathologic reflexes and overall normal sensation  Right knee evaluation The knee appears to be in varus alignment with medial joint line tenderness and small joint effusion, surprisingly the motion is preserved 125 there is a slight flexion contracture. Stability is confirmed by stress testing in anterior posterior plane and mediolateral plane. Strength is normal and extensor mechanism is intact and strong skin is warm dry and intact  Provocative meniscal tests are normal  Left knee evaluation Again we see varus alignment medial joint line tenderness small joint effusion preservation of motion slight flexion contracture ligamentous stability confirmed strength normal including extensor mechanism skin warm dry intact  Provocative meniscal tests are normal  Upper extremity exam  The right and left upper extremity:   Inspection and palpation revealed no abnormalities in the upper extremities.   Range of motion is full without contracture.  Motor exam is normal with grade 5 strength.  The joints are fully reduced without subluxation.  There is no atrophy or tremor and muscle tone is normal.  All joints are stable.   Assessment: Looking at his history he will need a medical consult as well as a cardiology consult. I don't think he understands his overall health at this point. I will arrange for him to see Dr. Everette Rank for further medical evaluation and management of Plavix but also like him to see his cardiologist  Diagnosis osteoarthritis left knee  The patient will be evaluated medically for total knee arthroplasty on the right

## 2013-08-19 NOTE — Patient Instructions (Signed)
See Dr Everette Rank for preop evaluation and plavix management   You have been scheduled for KNEE REPLACEMENT surgery.  All surgeries carry some risk.  Remember you always have the option of continued nonsurgical treatment. However in this situation the risks vs. the benefits favor surgery as the best treatment option. The risks of the surgery includes the following but is not limited to bleeding, infection, pulmonary embolus, death from anesthesia, nerve injury vascular injury or need for further surgery, continued pain.  Specific to this procedure the following risks and complications are rare but possible Stiffness Pain  Infection which may require several subsequent surgeries including an amputation of the infection cannot be removed Instability     Total Knee Replacement Total knee replacement is a procedure to replace your knee joint with an artificial knee joint (prosthetic knee joint). The purpose of this surgery is to reduce pain and improve your knee function. LET YOUR CAREGIVER KNOW ABOUT:   Any allergies you have.  Any medicines you are taking, including vitamins, herbs, eyedrops, over-the-counter medicines, and creams.  Any problems you have had with the use of anesthetics.  Family history of problems with the use of anesthetics.  Any blood disorders you have, including bleeding problems or clotting problems.  Previous surgeries you have had. RISKS AND COMPLICATIONS  Generally, total knee replacement is a safe procedure. However, as with any surgical procedure, complications can occur. Possible complications associated with total knee replacement include:  Loss of range of motion of the knee or instability.  Loosening of the prosthesis.  Infection.  Persistent pain. BEFORE THE PROCEDURE   Your caregiver will instruct you when you need to stop eating and drinking.  Ask your caregiver if you need to change or stop any regular medicines. PROCEDURE  Just before the  procedure you will receive medicine that will make you drowsy (sedative). This will be given through a tube that is inserted into one of your veins (intravenous [IV] tube). Then you will either receive medicine to block pain from the waist down through your legs (spinal block) or medicine to also receive medicine to make you fall asleep (general anesthetic). You may also receive medicine to block feeling in your leg (nerve block) to help ease pain after surgery. An incision will be made in your knee. Your surgeon will take out any damaged cartilage and bone by sawing off the damaged surfaces. Then the surgeon will put a new metal liner over the sawed off portion of your thigh bone (femur) and a plastic liner over the sawed off portion of one of the bones of your lower leg (tibia). This is to restore alignment and function to your knee. A plastic piece is often used to restore the surface of your knee cap. AFTER THE PROCEDURE  You will be taken to the recovery area. You may have drainage tubes to drain excess fluid from your knee. These tubes attach to a device that removes these fluids. Once you are awake, stable, and taking fluids well, you will be taken to your hospital room. You will receive physical therapy as prescribed by your caregiver. The length of your stay in the hospital after a knee replacement is 2 4 days. Your surgeon may recommend that you spend time (usually an additional 10 14 days) in an extended-care facility to help you begin walking again and improve your range of motion before you go home. You may also be prescribed blood-thinning medicine to decrease your risk of developing blood clots  in your leg. Document Released: 09/04/2000 Document Revised: 11/28/2011 Document Reviewed: 07/09/2011 [facility] Patient Information 2014 [place].

## 2013-08-20 ENCOUNTER — Encounter: Payer: Self-pay | Admitting: *Deleted

## 2013-08-20 ENCOUNTER — Telehealth: Payer: Self-pay | Admitting: Orthopedic Surgery

## 2013-08-20 NOTE — Telephone Encounter (Signed)
Patient/wife called to relay, per office visit yesterday, 08/19/13, that he has scheduled an appointment with his cardiologist, Dr. Harl Bowie, [place], [place], for 09/05/13.  Patient's wife asked about whether any tests will be ordered when he has the appointment there - I relayed that this will be up to Dr. Harl Bowie to determine.  (Knee surgery is pending medical clearance)  If any further advice, please call back to patient at [phone].

## 2013-08-20 NOTE — Telephone Encounter (Signed)
No further advice 

## 2013-09-05 ENCOUNTER — Encounter: Payer: Self-pay | Admitting: Cardiology

## 2013-09-05 ENCOUNTER — Ambulatory Visit (INDEPENDENT_AMBULATORY_CARE_PROVIDER_SITE_OTHER): Payer: Medicare Other | Admitting: Cardiology

## 2013-09-05 VITALS — BP 129/68 | HR 72 | Ht 67.0 in | Wt 216.8 lb

## 2013-09-05 DIAGNOSIS — Z01810 Encounter for preprocedural cardiovascular examination: Secondary | ICD-10-CM

## 2013-09-05 NOTE — Progress Notes (Signed)
Clinical Summary Leonard Lindsey is a 80 y.o.male seen today for follow up of the following medical problems.    1. CVA  - presumed embolic Nov 4818  - TEE showed atrial septal aneurysm w/ + bubble study, but no definitive ASD or PFO. No history of afib . 12/12 7 day monitor showed no afib  - echo w/ normal chamber sizes, normal LVEF. No orthopnea, no PND, no LE edema.    2. Preop - patient referred for cardiac clearance for knee surgery - denies any chest pain, no SOB, no DOE. Can walk over 3 miles a day without limitation. Can tolerate walking more than 3-4 flights of stairs without trouble    Past Medical History  Diagnosis Date  . Essential hypertension, benign   . Hypothyroid     Following remote treatment of hyperthyroidism with radioactive iodine  . Mixed hyperlipidemia   . Prostatism   . Carotid artery disease     56-31% LICA 4/97  . Atrial septal aneurysm     TEE 11/12 with positive buble study - no definite ASD  . Stroke     Prior history and more recent - likely embolic in rright occipital parietal lobe 11/12     No Known Allergies   Current Outpatient Prescriptions  Medication Sig Dispense Refill  . atorvastatin (LIPITOR) 40 MG tablet Take 40 mg by mouth at bedtime.       . clopidogrel (PLAVIX) 75 MG tablet Take 75 mg by mouth every morning.       . cyanocobalamin (,VITAMIN B-12,) 1000 MCG/ML injection Inject 1,000 mcg into the muscle every 30 (thirty) days.        . diclofenac sodium (VOLTAREN) 1 % GEL Apply 2 g topically 4 (four) times daily.  1 Tube  5  . diltiazem (CARTIA XT) 180 MG 24 hr capsule Take 180 mg by mouth every morning.      . dutasteride (AVODART) 0.5 MG capsule Take 0.5 mg by mouth at bedtime.       Marland Kitchen levothyroxine (SYNTHROID, LEVOTHROID) 137 MCG tablet Take 137 mcg by mouth every morning.       . lidocaine (LIDODERM) 5 % Place 1 patch onto the skin 2 (two) times daily as needed (for pain). Remove & Discard patch within 12 hours or as  directed by MD      . ramipril (ALTACE) 5 MG capsule Take 5 mg by mouth every morning.      . Tamsulosin HCl (FLOMAX) 0.4 MG CAPS Take 0.4 mg by mouth at bedtime.       . traMADol (ULTRAM) 50 MG tablet Take 50 mg by mouth daily as needed for pain.      Marland Kitchen triamterene-hydrochlorothiazide (DYAZIDE) 37.5-25 MG per capsule Take 1 capsule by mouth every morning.        No current facility-administered medications for this visit.     Past Surgical History  Procedure Laterality Date  . Abdominal surgery      Tumor removal  . Tee without cardioversion  05/09/2011    Procedure: TRANSESOPHAGEAL ECHOCARDIOGRAM (TEE);  Surgeon: Lelon Perla, MD;  [facility] ENDOSCOPY;  Service: Cardiovascular;  Laterality: N/A;     No Known Allergies    Family History  Problem Relation Age of Onset  . Anesthesia problems Neg Hx   . Hypotension Neg Hx   . Malignant hyperthermia Neg Hx   . Pseudochol deficiency Neg Hx      Social History Mr.  Lindsey reports that he has never smoked. He has never used smokeless tobacco. Leonard Lindsey reports that he does not drink alcohol.   Review of Systems CONSTITUTIONAL: No weight loss, fever, chills, weakness or fatigue.  HEENT: Eyes: No visual loss, blurred vision, double vision or yellow sclerae.No hearing loss, sneezing, congestion, runny nose or sore throat.  SKIN: No rash or itching.  CARDIOVASCULAR: per HPI RESPIRATORY: No shortness of breath, cough or sputum.  GASTROINTESTINAL: No anorexia, nausea, vomiting or diarrhea. No abdominal pain or blood.  GENITOURINARY: No burning on urination, no polyuria NEUROLOGICAL: No headache, dizziness, syncope, paralysis, ataxia, numbness or tingling in the extremities. No change in bowel or bladder control.  MUSCULOSKELETAL: Knee pain LYMPHATICS: No enlarged nodes. No history of splenectomy.  PSYCHIATRIC: No history of depression or anxiety.  ENDOCRINOLOGIC: No reports of sweating, cold or heat intolerance. No  polyuria or polydipsia.  Marland Kitchen   Physical Examination p 72 bp 129/68 Wt 216 lbs BMI 34 Gen: resting comfortably, no acute distress HEENT: no scleral icterus, pupils equal round and reactive, no palptable cervical adenopathy,  CV: RR, no m/r/g, no JVD, no carotid bruits Resp: Clear to auscultation bilaterally GI: abdomen is soft, non-tender, non-distended, normal bowel sounds, no hepatosplenomegaly MSK: extremities are warm, no edema.  Skin: warm, no rash Neuro:  no focal deficits Psych: appropriate affect   Diagnostic Studies 04/2011 Echo: LVEF 55-60%, mild AI, mild MR, normal LA   04/2011 TEE: LVEF 55-60%, no intracardiac thrombus, atrial septum aneurysm w/ positive bubble study,    09/05/13 Clinic EKG NSR  Assessment and Plan  1.CVA  - prior CVA 2012, thought possibly cardioembolic however never confirmed. No evidence of afib on monitoring, no definitive shunt, no intracardiac thrombus on TEE. On plavix for secondary stroke prevention.  - continue secondary prevention measures with plavix, statin, ACE-I  2. Preop evaluatoin  - ok to hold plavix for upcoming orthopedic procedure, resume once ok from surgical standpoint. He is not on it for any cardiac condition, only for stroke prevention.  - he has no acute active cardiac conditions. He tolerates greater than 4 METs on a regular basis without any significant limitation. He is being considered for an intermediate risk procedure. Recommend proceed w/ planned procedure, do not recommend any further cardiac testing or intervention prior to surgery.    Follow up 1 year   Arnoldo Lenis, M.D., F.A.C.C.

## 2013-09-05 NOTE — Patient Instructions (Signed)
Your physician recommends that you schedule a follow-up appointment in: 1 year with Dr. Harl Bowie. You should receive a letter in the mail in 10 months. If you do not receive this letter by January 2016 call our office to schedule this appointment.   Your physician recommends that you continue on your current medications as directed. Please refer to the Current Medication list given to you today.

## 2013-09-05 NOTE — Progress Notes (Signed)
Prostate done this January.  Here today for clearance for right knee replacement, going to see Dr. Aline Brochure next week.

## 2013-09-09 ENCOUNTER — Ambulatory Visit (INDEPENDENT_AMBULATORY_CARE_PROVIDER_SITE_OTHER): Payer: Medicare Other

## 2013-09-09 ENCOUNTER — Ambulatory Visit (INDEPENDENT_AMBULATORY_CARE_PROVIDER_SITE_OTHER): Payer: Medicare Other | Admitting: Orthopedic Surgery

## 2013-09-09 VITALS — BP 128/72 | Ht 67.0 in | Wt 216.0 lb

## 2013-09-09 DIAGNOSIS — M1711 Unilateral primary osteoarthritis, right knee: Secondary | ICD-10-CM | POA: Insufficient documentation

## 2013-09-09 DIAGNOSIS — IMO0002 Reserved for concepts with insufficient information to code with codable children: Secondary | ICD-10-CM

## 2013-09-09 DIAGNOSIS — M1710 Unilateral primary osteoarthritis, unspecified knee: Secondary | ICD-10-CM

## 2013-09-09 NOTE — Progress Notes (Signed)
Patient ID: Leonard Lindsey, male   DOB: [birth year], 80 y.o.   MRN: [id number]  Chief Complaint  Patient presents with  . Follow-up    3 week recheck Right knee, discuss surgery    Preop for right total knee  Right knee pain, he is anxious to get the knee surgery done.  Cardiac clearance was obtained  The Plavix can be stopped 5 days prior to surgery  All questions were answered  History and physical exam were repeated see dictated reports  Followup after surgery approximately 2 weeks for staple removal

## 2013-09-09 NOTE — Patient Instructions (Signed)
Must be off plavix 5 days before surgery

## 2013-09-10 ENCOUNTER — Other Ambulatory Visit: Payer: Self-pay | Admitting: *Deleted

## 2013-09-10 MED ORDER — BUPIVACAINE LIPOSOME 1.3 % IJ SUSP: 20.0000 mL | Freq: Once | INTRAMUSCULAR | Status: AC

## 2013-09-15 ENCOUNTER — Telehealth: Payer: Self-pay | Admitting: Orthopedic Surgery

## 2013-09-15 NOTE — Telephone Encounter (Signed)
Regarding surgery scheduled, in-patient, CPT [phone], ICD9 715.96, 715.16, scheduled 10/08/13, at [facility], contacted insurer(s) as follows:  -Primary insurer, [employer], ph# [phone]; per Debby Freiberg, 09/15/13 4:39pm - received REF# [id number]; Authorization pending * Follow up by 09/23/13 to check status.  -Secondary insurer, [employer] of state, [place], ph# [phone]; reached automated voice response system, 09/15/13,4:55p.m, disconnected awaiting representative.  Try back.

## 2013-09-17 NOTE — Telephone Encounter (Signed)
Called patient's secondary, [employer] [place] [employer] [place] [phone] - initially verified benefits, current and active per automated voice response system, Ref# [id number].  Per Utilization management representative, Karn Pickler, no pre-authorization is required for the in-patient procedure scheduled 10/08/13,  as this coverage is secondary to a Medicare plan. REF # [id number] U. 09/17/13, 3:35p.m.

## 2013-09-24 ENCOUNTER — Ambulatory Visit (HOSPITAL_COMMUNITY)
Admission: RE | Admit: 2013-09-24 | Discharge: 2013-09-24 | Disposition: A | Discharge status: Home / Self Care | Payer: Medicare Other | Source: Ambulatory Visit | Attending: Family Medicine | Admitting: Family Medicine

## 2013-09-24 ENCOUNTER — Encounter (HOSPITAL_COMMUNITY): Payer: Self-pay | Admitting: Pharmacy Technician

## 2013-09-24 ENCOUNTER — Other Ambulatory Visit (HOSPITAL_COMMUNITY): Payer: Self-pay | Admitting: Family Medicine

## 2013-09-24 DIAGNOSIS — R05 Cough: Secondary | ICD-10-CM

## 2013-09-24 DIAGNOSIS — R509 Fever, unspecified: Secondary | ICD-10-CM

## 2013-09-24 DIAGNOSIS — R0602 Shortness of breath: Secondary | ICD-10-CM | POA: Insufficient documentation

## 2013-09-24 DIAGNOSIS — R058 Other specified cough: Secondary | ICD-10-CM

## 2013-09-24 DIAGNOSIS — R0600 Dyspnea, unspecified: Secondary | ICD-10-CM

## 2013-09-24 DIAGNOSIS — R059 Cough, unspecified: Secondary | ICD-10-CM | POA: Insufficient documentation

## 2013-09-24 NOTE — Telephone Encounter (Signed)
09/24/13 Follow up to [employer] per online and per phone:  "Approved" with same Ref/Pre-Auth #: # [id number] per Janalee Dane, and online website.  No further clinicals needed for 10/08/13 date of service.

## 2013-10-03 ENCOUNTER — Encounter (HOSPITAL_COMMUNITY): Payer: Self-pay

## 2013-10-03 ENCOUNTER — Encounter (HOSPITAL_COMMUNITY)
Admission: RE | Admit: 2013-10-03 | Discharge: 2013-10-03 | Disposition: A | Discharge status: Home / Self Care | Payer: Medicare Other | Source: Ambulatory Visit | Attending: Orthopedic Surgery | Admitting: Orthopedic Surgery

## 2013-10-03 DIAGNOSIS — Z01812 Encounter for preprocedural laboratory examination: Secondary | ICD-10-CM | POA: Insufficient documentation

## 2013-10-03 HISTORY — DX: Unspecified asthma, uncomplicated: J45.909

## 2013-10-03 HISTORY — DX: Unspecified osteoarthritis, unspecified site: M19.90

## 2013-10-03 HISTORY — DX: Benign prostatic hyperplasia without lower urinary tract symptoms: N40.0

## 2013-10-03 HISTORY — DX: Hyperlipidemia, unspecified: E78.5

## 2013-10-03 LAB — BASIC METABOLIC PANEL
BUN: 17 mg/dL (ref 6–23)
CALCIUM: 9.3 mg/dL (ref 8.4–10.5)
CO2: 25 mEq/L (ref 19–32)
Chloride: 102 mEq/L (ref 96–112)
Creatinine, Ser: 1.02 mg/dL (ref 0.50–1.35)
GFR calc Af Amer: 79 mL/min — ABNORMAL LOW (ref >90)
GFR calc non Af Amer: 68 mL/min — ABNORMAL LOW (ref >90)
Glucose, Bld: 121 mg/dL — ABNORMAL HIGH (ref 70–99)
Potassium: 4.2 mEq/L (ref 3.7–5.3)
SODIUM: 139 meq/L (ref 137–147)

## 2013-10-03 LAB — CBC
HCT: 44.8 % (ref 39.0–52.0)
Hemoglobin: 15.5 g/dL (ref 13.0–17.0)
MCH: 30.5 pg (ref 26.0–34.0)
MCHC: 34.6 g/dL (ref 30.0–36.0)
MCV: 88 fL (ref 78.0–100.0)
PLATELETS: 219 10*3/uL (ref 150–400)
RBC: 5.09 MIL/uL (ref 4.22–5.81)
RDW: 13.4 % (ref 11.5–15.5)
WBC: 6.2 10*3/uL (ref 4.0–10.5)

## 2013-10-03 LAB — ABO/RH: ABO/RH(D): A POS

## 2013-10-03 LAB — PREPARE RBC (CROSSMATCH)

## 2013-10-03 LAB — SURGICAL PCR SCREEN
MRSA, PCR: POSITIVE — AB
Staphylococcus aureus: POSITIVE — AB

## 2013-10-03 MED ORDER — MUPIROCIN 2 % EX OINT
TOPICAL_OINTMENT | CUTANEOUS | Status: AC
  Filled 2013-10-03: qty 22

## 2013-10-03 NOTE — Patient Instructions (Signed)
Leonard Lindsey  10/03/2013   Your procedure is scheduled on:  10/08/13  Report to [facility] at 0850 AM.  Call this number if you have problems the morning of surgery: [phone]   Remember:   Do not eat food or drink liquids after midnight.   Take these medicines the morning of surgery with A SIP OF WATER: synthroid, diltiazem, ramipril, dyazide   Do not wear jewelry, make-up or nail polish.  Do not wear lotions, powders, or perfumes. You may wear deodorant.  Do not shave 48 hours prior to surgery. Men may shave face and neck.  Do not bring valuables to the hospital.  [facility] is not responsible                  for any belongings or valuables.               Contacts, dentures or bridgework may not be worn into surgery.  Leave suitcase in the car. After surgery it may be brought to your room.  For patients admitted to the hospital, discharge time is determined by your                treatment team.               Patients discharged the day of surgery will not be allowed to drive  home.  Name and phone number of your driver: family  Special Instructions: Shower using CHG 2 nights before surgery and the night before surgery.  If you shower the day of surgery use CHG.  Use special wash - you have one bottle of CHG for all showers.  You should use approximately 1/3 of the bottle for each shower.   Please read over the following fact sheets that you were given: Pain Booklet, Total Joint Packet, Surgical Site Infection Prevention, Anesthesia Post-op Instructions and Care and Recovery After Surgery   PATIENT INSTRUCTIONS POST-ANESTHESIA  IMMEDIATELY FOLLOWING SURGERY:  Do not drive or operate machinery for the first twenty four hours after surgery.  Do not make any important decisions for twenty four hours after surgery or while taking narcotic pain medications or sedatives.  If you develop intractable nausea and vomiting or a severe headache please notify your doctor  immediately.  FOLLOW-UP:  Please make an appointment with your surgeon as instructed. You do not need to follow up with anesthesia unless specifically instructed to do so.  WOUND CARE INSTRUCTIONS (if applicable):  Keep a dry clean dressing on the anesthesia/puncture wound site if there is drainage.  Once the wound has quit draining you may leave it open to air.  Generally you should leave the bandage intact for twenty four hours unless there is drainage.  If the epidural site drains for more than 36-48 hours please call the anesthesia department.  QUESTIONS?:  Please feel free to call your physician or the hospital operator if you have any questions, and they will be happy to assist you.      Total Knee Replacement Total knee replacement is a procedure to replace your knee joint with an artificial knee joint (prosthetic knee joint). The purpose of this surgery is to reduce pain and improve your knee function. LET YOUR CAREGIVER KNOW ABOUT:   Any allergies you have.  Any medicines you are taking, including vitamins, herbs, eyedrops, over-the-counter medicines, and creams.  Any problems you have had with the use of anesthetics.  Family history of problems with the use of anesthetics.  Any blood disorders you have, including bleeding problems or clotting problems.  Previous surgeries you have had. RISKS AND COMPLICATIONS  Generally, total knee replacement is a safe procedure. However, as with any surgical procedure, complications can occur. Possible complications associated with total knee replacement include:  Loss of range of motion of the knee or instability.  Loosening of the prosthesis.  Infection.  Persistent pain. BEFORE THE PROCEDURE   Your caregiver will instruct you when you need to stop eating and drinking.  Ask your caregiver if you need to change or stop any regular medicines. PROCEDURE  Just before the procedure you will receive medicine that will make you drowsy  (sedative). This will be given through a tube that is inserted into one of your veins (intravenous [IV] tube). Then you will either receive medicine to block pain from the waist down through your legs (spinal block) or medicine to also receive medicine to make you fall asleep (general anesthetic). You may also receive medicine to block feeling in your leg (nerve block) to help ease pain after surgery. An incision will be made in your knee. Your surgeon will take out any damaged cartilage and bone by sawing off the damaged surfaces. Then the surgeon will put a new metal liner over the sawed off portion of your thigh bone (femur) and a plastic liner over the sawed off portion of one of the bones of your lower leg (tibia). This is to restore alignment and function to your knee. A plastic piece is often used to restore the surface of your knee cap. AFTER THE PROCEDURE  You will be taken to the recovery area. You may have drainage tubes to drain excess fluid from your knee. These tubes attach to a device that removes these fluids. Once you are awake, stable, and taking fluids well, you will be taken to your hospital room. You will receive physical therapy as prescribed by your caregiver. The length of your stay in the hospital after a knee replacement is 2 4 days. Your surgeon may recommend that you spend time (usually an additional 10 14 days) in an extended-care facility to help you begin walking again and improve your range of motion before you go home. You may also be prescribed blood-thinning medicine to decrease your risk of developing blood clots in your leg. Document Released: 09/04/2000 Document Revised: 11/28/2011 Document Reviewed: 07/09/2011 [facility] Patient Information 2014 [place]. Preparing for Knee Replacement Recovery from knee replacement surgery can be made easier and more comfortable by taking steps to be prepared before surgery. This includes:  Arranging for others to help  you.  Preparing your home.  Making sure your body is prepared by doing a pre-operative exam and being as healthy as you can.  Doing exercises before your surgery as told by your caregiver. Also, you can ease any concerns about your financial responsibilities by calling your insurance company after you decide to have surgery.In addition to your surgery and hospital stay, you will want to ask about your coverage for medical equipment, rehab facilities, and home care. ARRANGING FOR HELP  You will be getting stronger and more mobile every day. However, in the first couple weeks after surgery, it is unlikely you will be able to do all your daily activities as easily as before your surgery.You will tire easily and still have limited movement in your leg.Follow these guidelines to best arrange for the help you may need after your surgery:  Arrange for someone to drive you home  from the hospital.Your surgeon will be able to tell you how many days you can expect to be in the hospital.  Cancel all work, care-giving, and volunteer responsibilities for at least 4 to 6 weeks after your surgery.  If you live alone, arrange for someone to care for your home and pets for the first 4 to 6 weeks.  Select someone with whom you feel comfortable to be with you day and night for the first week.This person will help you with your exercises and personal care, like bathing and using the toilet.  Arrange for drivers to bring you to and from your doctor and therapy appointments, as well as to the grocery store and other places you need to go, for at least 4 to 6 weeks.  Select 2 or 3 rehab facilities where you would be comfortable recovering just in case you are not able to go directly home to recover. PREPARING YOUR HOME  Remove all clutter from your floors.  To see if you will be able to move in these spaces with a wheeled walker, hold your hands out about 6 inches from your sides. Then walk from your bed to  the bathroom. Walk from your resting spot to your kitchen and bathroom.If you do not hit anything with your hands, you probably have enough room.  Remove throw rugs.  Move the items you use often to shelves and drawers that are at countertop height. Move items in your bathroom, kitchen, and bedroom.  Prepare a few meals that you can freeze and reheat later.  Do not plan on recovering in bed. It is better for your health to sit upright.You may wish to use a recliner with a small table nearby.Keep the items you use most frequently on that table. These may include the TV remote, a cordless phone, a book or laptop computer, water glass, and any other items of your choice.  Consider adding grab bars in the shower and near the toilet.  While you are in the hospital, you will learn about equipment helpful for your recovery.Some of the equipment includes raised toilet seats, tub benches, and shower benches.Often, your hospital care team will help you decide what you need and can direct you about where to buy these items.You may not need all of these items, and they are not often returnable, so it is not recommended that you buy them before going to the hospital. [place]  Complete a pre-operative exam.This will ensure that your body is healthy enough to safely complete this surgery.Be certain to bring a complete list of all your medicines and supplements (herbs and vitamins). You may be advised to have additional tests to ensure your safety.  Complete elective dental care and routine cleanings before the surgery.Germs from anywhere in your body, including those in your mouth, can travel to your new joint and infect it. It is important not to have any dental work performed for at least 3 months after your surgery.After surgery, be sure to tell your dentist about your joint replacement.  Maintain a healthy diet.Unless advised by your surgeon, do not drastically change your diet  before your surgery.  Quit smoking.Get help from your caregiver if you need it.  The day before your surgery, follow your surgeon's directions for showering, eating and drinking.These directions are for your safety. EXERCISES Your caregiver may have you do the following exercises before your surgery. Be sure to follow the exercise program your caregiver prescribes for you. Doing the exercises  on both sides will help prepare your "good" sideas well. While completing these exercises, remember:   Stretch as long as you can, up to 30 seconds, without pain developing.  You should only feel a gentle lengthening or release in the stretched tissue. Ankle Pumps 1. While sitting with your knees straight, draw the top of your feet upwards by flexing your ankles. 2. Then, reverse the motion, pointing your toes downward. 3. Repeat 10 to 20 times.Complete this exercise 1 to 2 times per day. Heel Slides 1. Lie on your back with both knees straight.(If this causes back discomfort, bend your opposite knee, placing your foot flat on the floor.) 2. Slowly slide your heel back toward your buttocks until you feel a gentle stretch in the front of your knee or thigh. 3. Slowly slide your heel back to the starting position. 4. Repeat 10 to 20 times.Complete this exercise 1 to 2 times per day. Quadriceps Sets 1. Lie on your back with your sore leg extended and your opposite knee bent. 2. Gradually tense the muscles in the front of your thigh.You should see either your kneecap slide up toward your hip or increased dimpling just above the knee.This motion will push the back of the knee down toward the floor. 3. Hold the muscle as tight as you can without increasing your pain for 10 seconds. 4. Relax the muscles slowly and completely in between each repetition. 5. Repeat 10 to 20 times.Complete this exercise 1 to 2 times per day. Short Arc Kicks 1. Lie on your back.Place a 4 to 6 inch towel roll under  your sore knee so that the knee slightly bends. 2. Raise only your lower leg by tightening the muscles in the front of your thigh.Do not allow your thigh to rise. 3. Hold this position for 5 seconds. 4. Repeat 10 to 20 times.Complete this exercise 1 to 2 times per day. Straight Leg Raises 1. Lie on your back with your sore leg extended and your opposite knee bent. 2. Tense the muscles in the front of your sore thigh.You should see either your kneecap slide up or increased dimpling just above the knee. Your thigh may even quiver. 3. Tighten these muscles even more and raise your leg 4 to 6 inches off the floor.Hold for 3 to 5 seconds. 4. Keeping these muscles tense, lower your leg. 5. Relax the muscles slowly and completely in between each repetition. 6. Repeat 10 to 20 times.Complete this exercise 1 to 2 times per day. Arm Chair Push-ups 1. Find a firm, non-wheeled chair with solid armrests. 2. Sitting in the chair, extend your sore leg straight out in front of you. 3. Lift up your body weight, using your arms and opposite leg. 4. Slowly lower your body weight. 5. Repeat 10 to 20 times.Complete this exercise 1 to 2 times per day. Document Released: 09/02/2010 Document Revised: 08/21/2011 Document Reviewed: 09/02/2010 [facility] Patient Information 2014 [place].

## 2013-10-07 NOTE — H&P (Signed)
Patient ID: Leonard Lindsey, male   DOB: 09-02-1932, 80 y.o.   MRN: [id number] Chief complaint sore right knee  This is a 80 year old male who presents for evaluation of his right knee. He's had pain for some time he describes as 6 months to a year but I suspect his been longer than that. Most of his pain is on the medial side of his knee it is partially relieved by a brace but is exacerbated by walking getting up and down from a chair and associated with swelling. He denies catching locking or giving way he said no other treatment  He seems to be unaware of his medical problems with a severity of them and they are as follows:  Past Medical History   Diagnosis  Date   .  Essential hypertension, benign     .  Hypothyroid         Following remote treatment of hyperthyroidism with radioactive iodine   .  Mixed hyperlipidemia     .  Prostatism     .  Carotid artery disease         30-16% LICA 0/10   .  Atrial septal aneurysm         TEE 11/12 with positive buble study - no definite ASD   .  Stroke         Prior history and more recent - likely embolic in rright occipital parietal lobe 11/12    Past Surgical History  Procedure Laterality Date  . Abdominal surgery      Tumor removal  . Tee without cardioversion  05/09/2011    Procedure: TRANSESOPHAGEAL ECHOCARDIOGRAM (TEE);  Surgeon: Lelon Perla, MD;  Location[facility] ENDOSCOPY;  Service: Cardiovascular;  Laterality: N/A;  . Prostate surgery     Family History  Problem Relation Age of Onset  . Anesthesia problems Neg Hx   . Hypotension Neg Hx   . Malignant hyperthermia Neg Hx   . Pseudochol deficiency Neg Hx    History   Social History  . Marital Status: Married    Spouse Name: N/A    Number of Children: N/A  . Years of Education: N/A   Occupational History  . Retired    Social History Main Topics  . Smoking status: Never Smoker   . Smokeless tobacco: Never Used  . Alcohol Use: No  . Drug Use: No  . Sexual Activity: Not  on file   Other Topics Concern  . Not on file   Social History Narrative  . No narrative on file   He's recently had a transurethral retrograde prostatectomy within the last 2 months  System review difficulty urination excessive urination other systems are negative denies chest pain or shortness of breath although not sure he is dressing himself enough to bring that about if it were present  No allergies  He is also had 2 strokes  He has some apparent coronary artery disease  Family history of arthritis  He is married his wife is with him his been retired for 12 years she does not smoke he does drink and he took some college courses as his highest level of education  Vital signs BP 126/73  Ht 5' 9.5" (1.765 m)  Wt 217 lb (98.431 kg)  BMI 31.60 kg/m2   General appearance: Development, nutrition are normal. Body habitus medium   No gross deformities are noted and grooming normal.  Peripheral vascular system no swelling or varicose veins are noted and pulses are  palpable without tenderness, temperature warm to touch no edema.  No palpable lymph nodes are noted in the cervical area or axillae.  Gait abnormalities osteoarthritic abnormalities which include the speed of gait which is abnormal and the length of gait per step Balance is normal  Neurologic exam shows no pathologic reflexes and overall normal sensation Upper extremity exam  The right and left upper extremity:   Inspection and palpation revealed no abnormalities in the upper extremities.   Range of motion is full without contracture.  Motor exam is normal with grade 5 strength.  The joints are fully reduced without subluxation.  There is no atrophy or tremor and muscle tone is normal.  All joints are stable.  Right knee evaluation The knee appears to be in varus alignment with medial joint line tenderness and small joint effusion, surprisingly the motion is preserved 125 there is a slight flexion  contracture. Stability is confirmed by stress testing in anterior posterior plane and mediolateral plane. Strength is normal and extensor mechanism is intact and strong skin is warm dry and intact   Provocative meniscal tests are normal  Left knee evaluation Again we see varus alignment medial joint line tenderness small joint effusion preservation of motion slight flexion contracture ligamentous stability confirmed strength normal including extensor mechanism skin warm dry intact  Provocative meniscal tests are normal   Assessment: Looking at his history he will need a medical consult as well as a cardiology consult. I don't think he understands his overall health at this point. I will arrange for him to see Dr. Everette Rank for further medical evaluation and management of Plavix but also like him to see his cardiologist   Patellofemoral joint shows symmetric joint space with surrounding osteophytes and some bone sclerosis in the subchondral plate there are marginal osteophytes on the medial and lateral aspects of the trochlea the patella is centered  Lateral x-ray shows some posterior osteophytes superior patellofemoral osteophytes and grade 4 arthritis of the patellofemoral joint in terms of joint space  We also see bone to bone of both condyles of the femoral tibial articulation  The knee is in obvious varus. Multiple bone spurs severe joint space narrowing medially with bone spurs and sclerotic bone the entire joint is involved as the lateral compartment shows bone spurs and joint space narrowing as well  Diagnostic Studies 04/2011 Echo: LVEF 55-60%, mild AI, mild MR, normal LA    04/2011 TEE: LVEF 55-60%, no intracardiac thrombus, atrial septum aneurysm w/ positive bubble study,     09/05/13 Clinic EKG NSR  Assessment and Plan   1.CVA  - prior CVA 2012, thought possibly cardioembolic however never confirmed. No evidence of afib on monitoring, no definitive shunt, no intracardiac  thrombus on TEE. On plavix for secondary stroke prevention.   - continue secondary prevention measures with plavix, statin, ACE-I  2. Preop evaluatoin  - ok to hold plavix for upcoming orthopedic procedure, resume once ok from surgical standpoint. He is not on it for any cardiac condition, only for stroke prevention.   - he has no acute active cardiac conditions. He tolerates greater than 4 METs on a regular basis without any significant limitation. He is being considered for an intermediate risk procedure. Recommend proceed w/ planned procedure, do not recommend any further cardiac testing or intervention prior to surgery.   Impression severe arthritis right knee  Plan right tka

## 2013-10-08 ENCOUNTER — Inpatient Hospital Stay (HOSPITAL_COMMUNITY)
Admission: RE | Admit: 2013-10-08 | Discharge: 2013-10-11 | DRG: 470 | Disposition: A | Discharge status: Skilled Nursing Facility | Payer: Medicare Other | Source: Ambulatory Visit | Attending: Orthopedic Surgery | Admitting: Orthopedic Surgery

## 2013-10-08 ENCOUNTER — Inpatient Hospital Stay (HOSPITAL_COMMUNITY): Payer: Medicare Other

## 2013-10-08 ENCOUNTER — Inpatient Hospital Stay (HOSPITAL_COMMUNITY): Payer: Medicare Other | Admitting: Anesthesiology

## 2013-10-08 ENCOUNTER — Encounter (HOSPITAL_COMMUNITY)
Admission: RE | Disposition: A | Discharge status: Skilled Nursing Facility | Payer: Self-pay | Source: Ambulatory Visit | Attending: Orthopedic Surgery

## 2013-10-08 ENCOUNTER — Encounter (HOSPITAL_COMMUNITY): Payer: Medicare Other | Admitting: Anesthesiology

## 2013-10-08 ENCOUNTER — Encounter (HOSPITAL_COMMUNITY): Payer: Self-pay | Admitting: Anesthesiology

## 2013-10-08 DIAGNOSIS — M1710 Unilateral primary osteoarthritis, unspecified knee: Principal | ICD-10-CM | POA: Diagnosis present

## 2013-10-08 DIAGNOSIS — Z8673 Personal history of transient ischemic attack (TIA), and cerebral infarction without residual deficits: Secondary | ICD-10-CM

## 2013-10-08 DIAGNOSIS — I10 Essential (primary) hypertension: Secondary | ICD-10-CM | POA: Diagnosis present

## 2013-10-08 DIAGNOSIS — M179 Osteoarthritis of knee, unspecified: Secondary | ICD-10-CM | POA: Diagnosis present

## 2013-10-08 DIAGNOSIS — Q682 Congenital deformity of knee: Secondary | ICD-10-CM

## 2013-10-08 DIAGNOSIS — E039 Hypothyroidism, unspecified: Secondary | ICD-10-CM | POA: Diagnosis present

## 2013-10-08 DIAGNOSIS — IMO0002 Reserved for concepts with insufficient information to code with codable children: Secondary | ICD-10-CM

## 2013-10-08 DIAGNOSIS — E782 Mixed hyperlipidemia: Secondary | ICD-10-CM | POA: Diagnosis present

## 2013-10-08 DIAGNOSIS — M2340 Loose body in knee, unspecified knee: Secondary | ICD-10-CM | POA: Diagnosis present

## 2013-10-08 DIAGNOSIS — M1711 Unilateral primary osteoarthritis, right knee: Secondary | ICD-10-CM

## 2013-10-08 HISTORY — PX: TOTAL KNEE ARTHROPLASTY: SHX125

## 2013-10-08 LAB — PREPARE RBC (CROSSMATCH)

## 2013-10-08 SURGERY — ARTHROPLASTY, KNEE, TOTAL
Anesthesia: Spinal | Site: Knee | Laterality: Right

## 2013-10-08 MED ORDER — BUPIVACAINE-EPINEPHRINE PF 0.5-1:200000 % IJ SOLN
INTRAMUSCULAR | Status: AC
  Filled 2013-10-08: qty 60

## 2013-10-08 MED ORDER — ACETAMINOPHEN 500 MG PO TABS
500.0000 mg | ORAL_TABLET | Freq: Once | ORAL | Status: AC
  Administered 2013-10-08: 500 mg via ORAL
  Filled 2013-10-08: qty 1

## 2013-10-08 MED ORDER — PHENYLEPHRINE HCL 10 MG/ML IJ SOLN
INTRAMUSCULAR | Status: AC
  Filled 2013-10-08: qty 1

## 2013-10-08 MED ORDER — CEFAZOLIN SODIUM 1-5 GM-% IV SOLN
1.0000 g | Freq: Four times a day (QID) | INTRAVENOUS | Status: AC
  Administered 2013-10-08 (×2): 1 g via INTRAVENOUS
  Filled 2013-10-08 (×2): qty 50

## 2013-10-08 MED ORDER — OXYCODONE HCL 5 MG PO TABS
5.0000 mg | ORAL_TABLET | ORAL | Status: DC | PRN
  Administered 2013-10-08: 10 mg via ORAL
  Administered 2013-10-09: 5 mg via ORAL
  Filled 2013-10-08: qty 2
  Filled 2013-10-08: qty 1

## 2013-10-08 MED ORDER — ONDANSETRON HCL 4 MG/2ML IJ SOLN
4.0000 mg | Freq: Once | INTRAMUSCULAR | Status: AC
  Administered 2013-10-08: 4 mg via INTRAVENOUS

## 2013-10-08 MED ORDER — TAMSULOSIN HCL 0.4 MG PO CAPS
0.4000 mg | ORAL_CAPSULE | Freq: Every day | ORAL | Status: DC
  Administered 2013-10-08 – 2013-10-10 (×3): 0.4 mg via ORAL
  Filled 2013-10-08 (×3): qty 1

## 2013-10-08 MED ORDER — SODIUM CHLORIDE 0.9 % IR SOLN
Status: DC | PRN
  Administered 2013-10-08: 1000 mL

## 2013-10-08 MED ORDER — EPHEDRINE SULFATE 50 MG/ML IJ SOLN
INTRAMUSCULAR | Status: AC
Start: 2013-10-08 — End: 2013-10-08
  Filled 2013-10-08: qty 1

## 2013-10-08 MED ORDER — ATORVASTATIN CALCIUM 40 MG PO TABS
40.0000 mg | ORAL_TABLET | Freq: Every day | ORAL | Status: DC
  Administered 2013-10-08 – 2013-10-10 (×3): 40 mg via ORAL
  Filled 2013-10-08 (×3): qty 1

## 2013-10-08 MED ORDER — DOCUSATE SODIUM 100 MG PO CAPS
100.0000 mg | ORAL_CAPSULE | Freq: Two times a day (BID) | ORAL | Status: DC
Start: 2013-10-08 — End: 2013-10-11
  Administered 2013-10-08 – 2013-10-11 (×6): 100 mg via ORAL
  Filled 2013-10-08 (×6): qty 1

## 2013-10-08 MED ORDER — CEFAZOLIN SODIUM-DEXTROSE 2-3 GM-% IV SOLR
2.0000 g | INTRAVENOUS | Status: AC
  Administered 2013-10-08: 2 g via INTRAVENOUS
  Filled 2013-10-08: qty 50

## 2013-10-08 MED ORDER — LIDOCAINE HCL (PF) 1 % IJ SOLN
INTRAMUSCULAR | Status: AC
  Filled 2013-10-08: qty 5

## 2013-10-08 MED ORDER — FENTANYL CITRATE 0.05 MG/ML IJ SOLN
INTRAMUSCULAR | Status: AC
  Filled 2013-10-08: qty 2

## 2013-10-08 MED ORDER — TRIAMTERENE-HCTZ 37.5-25 MG PO TABS
1.0000 | ORAL_TABLET | Freq: Every morning | ORAL | Status: DC
  Administered 2013-10-09 – 2013-10-11 (×3): 1 via ORAL
  Filled 2013-10-08 (×3): qty 1

## 2013-10-08 MED ORDER — ACETAMINOPHEN 650 MG RE SUPP: 650.0000 mg | Freq: Four times a day (QID) | RECTAL | Status: DC | PRN

## 2013-10-08 MED ORDER — SODIUM CHLORIDE 0.9 % IV SOLN
INTRAVENOUS | Status: DC
  Administered 2013-10-08 – 2013-10-10 (×3): via INTRAVENOUS

## 2013-10-08 MED ORDER — HYDROCODONE-ACETAMINOPHEN 5-325 MG PO TABS
1.0000 | ORAL_TABLET | Freq: Once | ORAL | Status: AC
  Administered 2013-10-08: 1 via ORAL
  Filled 2013-10-08: qty 1

## 2013-10-08 MED ORDER — SODIUM CHLORIDE 0.9 % IJ SOLN
INTRAMUSCULAR | Status: AC
  Filled 2013-10-08: qty 10

## 2013-10-08 MED ORDER — PROPOFOL 10 MG/ML IV BOLUS
INTRAVENOUS | Status: AC
  Filled 2013-10-08: qty 20

## 2013-10-08 MED ORDER — MENTHOL 3 MG MT LOZG: 1.0000 | LOZENGE | OROMUCOSAL | Status: DC | PRN

## 2013-10-08 MED ORDER — SENNA 8.6 MG PO TABS
1.0000 | ORAL_TABLET | Freq: Two times a day (BID) | ORAL | Status: DC
  Administered 2013-10-08 – 2013-10-11 (×6): 8.6 mg via ORAL
  Filled 2013-10-08 (×6): qty 1

## 2013-10-08 MED ORDER — ONDANSETRON HCL 4 MG PO TABS: 4.0000 mg | ORAL_TABLET | Freq: Four times a day (QID) | ORAL | Status: DC | PRN

## 2013-10-08 MED ORDER — FLEET ENEMA 7-19 GM/118ML RE ENEM: 1.0000 | ENEMA | Freq: Once | RECTAL | Status: AC | PRN

## 2013-10-08 MED ORDER — SODIUM CHLORIDE BACTERIOSTATIC 0.9 % IJ SOLN
INTRAMUSCULAR | Status: AC
  Filled 2013-10-08: qty 10

## 2013-10-08 MED ORDER — CHLORHEXIDINE GLUCONATE 4 % EX LIQD
60.0000 mL | Freq: Once | CUTANEOUS | Status: DC
Start: 2013-10-08 — End: 2013-10-08

## 2013-10-08 MED ORDER — LACTATED RINGERS IV SOLN
INTRAVENOUS | Status: DC
  Administered 2013-10-08: 09:00:00 via INTRAVENOUS

## 2013-10-08 MED ORDER — HYDROCODONE-ACETAMINOPHEN 7.5-325 MG PO TABS
1.0000 | ORAL_TABLET | ORAL | Status: DC
  Administered 2013-10-08 – 2013-10-10 (×9): 1 via ORAL
  Filled 2013-10-08 (×9): qty 1

## 2013-10-08 MED ORDER — LEVOTHYROXINE SODIUM 137 MCG PO TABS
137.0000 ug | ORAL_TABLET | Freq: Every day | ORAL | Status: DC
  Administered 2013-10-09 – 2013-10-11 (×3): 137 ug via ORAL
  Filled 2013-10-08 (×5): qty 1

## 2013-10-08 MED ORDER — LIDOCAINE HCL (CARDIAC) 10 MG/ML IV SOLN
INTRAVENOUS | Status: DC | PRN
  Administered 2013-10-08: 50 mg via INTRAVENOUS

## 2013-10-08 MED ORDER — LACTATED RINGERS IV SOLN
INTRAVENOUS | Status: DC | PRN
  Administered 2013-10-08 (×2): via INTRAVENOUS

## 2013-10-08 MED ORDER — DIPHENHYDRAMINE HCL 12.5 MG/5ML PO ELIX
12.5000 mg | ORAL_SOLUTION | ORAL | Status: DC | PRN
  Administered 2013-10-09: 25 mg via ORAL
  Filled 2013-10-08: qty 10

## 2013-10-08 MED ORDER — BISACODYL 10 MG RE SUPP: 10.0000 mg | Freq: Every day | RECTAL | Status: DC | PRN

## 2013-10-08 MED ORDER — ONDANSETRON HCL 4 MG/2ML IJ SOLN: 4.0000 mg | Freq: Four times a day (QID) | INTRAMUSCULAR | Status: DC | PRN

## 2013-10-08 MED ORDER — PHENOL 1.4 % MT LIQD: 1.0000 | OROMUCOSAL | Status: DC | PRN

## 2013-10-08 MED ORDER — POLYETHYLENE GLYCOL 3350 17 G PO PACK
17.0000 g | PACK | Freq: Every day | ORAL | Status: DC
  Administered 2013-10-09 – 2013-10-11 (×3): 17 g via ORAL
  Filled 2013-10-08 (×3): qty 1

## 2013-10-08 MED ORDER — SEVOFLURANE IN SOLN
RESPIRATORY_TRACT | Status: AC
  Filled 2013-10-08: qty 250

## 2013-10-08 MED ORDER — METHOCARBAMOL 500 MG PO TABS: 500.0000 mg | ORAL_TABLET | Freq: Four times a day (QID) | ORAL | Status: DC | PRN

## 2013-10-08 MED ORDER — RAMIPRIL 2.5 MG PO CAPS
5.0000 mg | ORAL_CAPSULE | Freq: Every morning | ORAL | Status: DC
  Administered 2013-10-09 – 2013-10-11 (×3): 5 mg via ORAL
  Filled 2013-10-08 (×3): qty 2

## 2013-10-08 MED ORDER — SODIUM CHLORIDE BACTERIOSTATIC 0.9 % IJ SOLN
INTRAMUSCULAR | Status: AC
  Filled 2013-10-08: qty 20

## 2013-10-08 MED ORDER — CYANOCOBALAMIN 1000 MCG/ML IJ SOLN: 1000.0000 ug | INTRAMUSCULAR | Status: DC

## 2013-10-08 MED ORDER — METOCLOPRAMIDE HCL 10 MG PO TABS
5.0000 mg | ORAL_TABLET | Freq: Three times a day (TID) | ORAL | Status: DC | PRN
Start: 2013-10-08 — End: 2013-10-11

## 2013-10-08 MED ORDER — ONDANSETRON HCL 4 MG/2ML IJ SOLN
INTRAMUSCULAR | Status: AC
  Filled 2013-10-08: qty 2

## 2013-10-08 MED ORDER — SODIUM CHLORIDE 0.9 % IR SOLN
Status: DC | PRN
Start: 2013-10-08 — End: 2013-10-08
  Administered 2013-10-08: 3000 mL

## 2013-10-08 MED ORDER — MIDAZOLAM HCL 2 MG/2ML IJ SOLN
INTRAMUSCULAR | Status: AC
  Filled 2013-10-08: qty 2

## 2013-10-08 MED ORDER — ACETAMINOPHEN 325 MG PO TABS
650.0000 mg | ORAL_TABLET | Freq: Four times a day (QID) | ORAL | Status: DC | PRN
  Administered 2013-10-10: 650 mg via ORAL
  Filled 2013-10-08: qty 2

## 2013-10-08 MED ORDER — FENTANYL CITRATE 0.05 MG/ML IJ SOLN
25.0000 ug | INTRAMUSCULAR | Status: DC | PRN
  Administered 2013-10-08 (×4): 50 ug via INTRAVENOUS

## 2013-10-08 MED ORDER — DUTASTERIDE 0.5 MG PO CAPS
0.5000 mg | ORAL_CAPSULE | Freq: Every day | ORAL | Status: DC
  Administered 2013-10-08 – 2013-10-10 (×3): 0.5 mg via ORAL
  Filled 2013-10-08 (×5): qty 1

## 2013-10-08 MED ORDER — ONDANSETRON HCL 4 MG/2ML IJ SOLN: 4.0000 mg | Freq: Once | INTRAMUSCULAR | Status: DC | PRN

## 2013-10-08 MED ORDER — BUPIVACAINE-EPINEPHRINE (PF) 0.5% -1:200000 IJ SOLN
INTRAMUSCULAR | Status: DC | PRN
  Administered 2013-10-08: 90 mL

## 2013-10-08 MED ORDER — BUPIVACAINE IN DEXTROSE 0.75-8.25 % IT SOLN
INTRATHECAL | Status: DC | PRN
  Administered 2013-10-08: 15 mg via INTRATHECAL

## 2013-10-08 MED ORDER — EPHEDRINE SULFATE 50 MG/ML IJ SOLN
INTRAMUSCULAR | Status: DC | PRN
  Administered 2013-10-08: 15 mg via INTRAVENOUS
  Administered 2013-10-08: 10 mg via INTRAVENOUS

## 2013-10-08 MED ORDER — BUPIVACAINE IN DEXTROSE 0.75-8.25 % IT SOLN
INTRATHECAL | Status: AC
  Filled 2013-10-08: qty 2

## 2013-10-08 MED ORDER — MIDAZOLAM HCL 2 MG/2ML IJ SOLN
1.0000 mg | INTRAMUSCULAR | Status: DC | PRN
  Administered 2013-10-08: 2 mg via INTRAVENOUS

## 2013-10-08 MED ORDER — PROPOFOL INFUSION 10 MG/ML OPTIME
INTRAVENOUS | Status: DC | PRN
  Administered 2013-10-08: 50 ug/kg/min via INTRAVENOUS
  Administered 2013-10-08 (×2): via INTRAVENOUS

## 2013-10-08 MED ORDER — SODIUM CHLORIDE 0.9 % IJ SOLN
INTRAMUSCULAR | Status: AC
  Filled 2013-10-08: qty 20

## 2013-10-08 MED ORDER — ALUM & MAG HYDROXIDE-SIMETH 200-200-20 MG/5ML PO SUSP: 30.0000 mL | ORAL | Status: DC | PRN

## 2013-10-08 MED ORDER — FENTANYL CITRATE 0.05 MG/ML IJ SOLN
INTRAMUSCULAR | Status: DC | PRN
  Administered 2013-10-08: 25 ug via INTRAVENOUS
  Administered 2013-10-08: 30 ug via INTRAVENOUS
  Administered 2013-10-08: 25 ug via INTRAVENOUS
  Administered 2013-10-08: 20 ug via INTRATHECAL

## 2013-10-08 MED ORDER — METHOCARBAMOL 100 MG/ML IJ SOLN
500.0000 mg | Freq: Four times a day (QID) | INTRAMUSCULAR | Status: DC | PRN
  Filled 2013-10-08: qty 5

## 2013-10-08 MED ORDER — METOCLOPRAMIDE HCL 5 MG/ML IJ SOLN: 5.0000 mg | Freq: Three times a day (TID) | INTRAMUSCULAR | Status: DC | PRN

## 2013-10-08 MED ORDER — ARTIFICIAL TEARS OP OINT
TOPICAL_OINTMENT | OPHTHALMIC | Status: AC
  Filled 2013-10-08: qty 7

## 2013-10-08 MED ORDER — DILTIAZEM HCL ER COATED BEADS 180 MG PO CP24
180.0000 mg | ORAL_CAPSULE | Freq: Every morning | ORAL | Status: DC
  Administered 2013-10-09 – 2013-10-11 (×3): 180 mg via ORAL
  Filled 2013-10-08 (×3): qty 1

## 2013-10-08 MED ORDER — BUPIVACAINE-EPINEPHRINE PF 0.5-1:200000 % IJ SOLN
INTRAMUSCULAR | Status: AC
  Filled 2013-10-08: qty 30

## 2013-10-08 MED ORDER — MORPHINE SULFATE 2 MG/ML IJ SOLN
2.0000 mg | INTRAMUSCULAR | Status: DC | PRN
  Administered 2013-10-08 – 2013-10-09 (×3): 2 mg via INTRAVENOUS
  Filled 2013-10-08 (×3): qty 1

## 2013-10-08 MED ORDER — DEXTROSE 5 % IV SOLN
500.0000 mg | Freq: Once | INTRAVENOUS | Status: AC
  Administered 2013-10-08: 500 mg via INTRAVENOUS
  Filled 2013-10-08: qty 5

## 2013-10-08 SURGICAL SUPPLY — 78 items
BAG HAMPER (MISCELLANEOUS) ×3 IMPLANT
BANDAGE ELASTIC 6 VELCRO NS (GAUZE/BANDAGES/DRESSINGS) ×2 IMPLANT
BANDAGE ESMARK 6X9 LF (GAUZE/BANDAGES/DRESSINGS) ×1 IMPLANT
BIT DRILL 3.2X128 (BIT) ×2 IMPLANT
BIT DRILL 3.2X128MM (BIT) ×2
BLADE HEX COATED 2.75 (ELECTRODE) ×3 IMPLANT
BLADE SAG 18X100X1.27 (BLADE) IMPLANT
BLADE SAGITTAL 25.0X1.27X90 (BLADE) ×2 IMPLANT
BLADE SAGITTAL 25.0X1.27X90MM (BLADE) ×1
BLADE SAW SAG 90X13X1.27 (BLADE) ×3 IMPLANT
BLADE SURG SZ10 CARB STEEL (BLADE) ×2 IMPLANT
BNDG CMPR 9X6 STRL LF SNTH (GAUZE/BANDAGES/DRESSINGS) ×1
BNDG ESMARK 6X9 LF (GAUZE/BANDAGES/DRESSINGS) ×3
BOWL SMART MIX CTS (DISPOSABLE) IMPLANT
CAPT FB KNEE W/COCR XLINK ×2 IMPLANT
CEMENT HV SMART SET (Cement) ×6 IMPLANT
CHLORAPREP W/TINT 26ML (MISCELLANEOUS) ×4 IMPLANT
CLOTH BEACON ORANGE TIMEOUT ST (SAFETY) ×3 IMPLANT
COVER LIGHT HANDLE STERIS (MISCELLANEOUS) ×12 IMPLANT
COVER PROBE W GEL 5X96 (DRAPES) ×3 IMPLANT
CUFF TOURNIQUET SINGLE 34IN LL (TOURNIQUET CUFF) ×2 IMPLANT
CUFF TOURNIQUET SINGLE 44IN (TOURNIQUET CUFF) IMPLANT
DECANTER SPIKE VIAL GLASS SM (MISCELLANEOUS) ×8 IMPLANT
DRAPE BACK TABLE (DRAPES) ×3 IMPLANT
DRAPE EXTREMITY T 121X128X90 (DRAPE) ×3 IMPLANT
DRSG MEPILEX BORDER 4X12 (GAUZE/BANDAGES/DRESSINGS) ×3 IMPLANT
DURAPREP 26ML APPLICATOR (WOUND CARE) ×6 IMPLANT
ELECT REM PT RETURN 9FT ADLT (ELECTROSURGICAL) ×3
ELECTRODE REM PT RTRN 9FT ADLT (ELECTROSURGICAL) ×1 IMPLANT
EVACUATOR 3/16  PVC DRAIN (DRAIN) ×2
EVACUATOR 3/16 PVC DRAIN (DRAIN) ×1 IMPLANT
FACESHIELD LNG OPTICON STERILE (SAFETY) IMPLANT
GLOVE BIO SURGEON STRL SZ7 (GLOVE) ×2 IMPLANT
GLOVE BIOGEL PI IND STRL 7.0 (GLOVE) IMPLANT
GLOVE BIOGEL PI INDICATOR 7.0 (GLOVE) ×6
GLOVE ECLIPSE 7.0 STRL STRAW (GLOVE) ×4 IMPLANT
GLOVE OPTIFIT SS 8.0 STRL (GLOVE) ×3 IMPLANT
GLOVE SKINSENSE NS SZ8.0 LF (GLOVE) ×4
GLOVE SKINSENSE STRL SZ8.0 LF (GLOVE) ×2 IMPLANT
GLOVE SS BIOGEL STRL SZ 6.5 (GLOVE) IMPLANT
GLOVE SS N UNI LF 8.5 STRL (GLOVE) ×3 IMPLANT
GLOVE SUPERSENSE BIOGEL SZ 6.5 (GLOVE) ×2
GOWN STRL REUS W/TWL LRG LVL3 (GOWN DISPOSABLE) ×12 IMPLANT
GOWN STRL REUS W/TWL XL LVL3 (GOWN DISPOSABLE) ×3 IMPLANT
HANDPIECE INTERPULSE COAX TIP (DISPOSABLE) ×3
HOOD W/PEELAWAY (MISCELLANEOUS) ×12 IMPLANT
INST SET MAJOR BONE (KITS) ×3 IMPLANT
IV NS IRRIG 3000ML ARTHROMATIC (IV SOLUTION) ×3 IMPLANT
KIT BLADEGUARD II DBL (SET/KITS/TRAYS/PACK) ×3 IMPLANT
KIT ROOM TURNOVER APOR (KITS) ×3 IMPLANT
MANIFOLD NEPTUNE II (INSTRUMENTS) ×3 IMPLANT
MARKER SKIN DUAL TIP RULER LAB (MISCELLANEOUS) ×3 IMPLANT
NDL HYPO 21X1.5 SAFETY (NEEDLE) ×1 IMPLANT
NDL HYPO 25X1 1.5 SAFETY (NEEDLE) ×1 IMPLANT
NEEDLE HYPO 21X1.5 SAFETY (NEEDLE) ×3 IMPLANT
NEEDLE HYPO 25X1 1.5 SAFETY (NEEDLE) ×3 IMPLANT
NS IRRIG 1000ML POUR BTL (IV SOLUTION) ×3 IMPLANT
PACK TOTAL JOINT (CUSTOM PROCEDURE TRAY) ×3 IMPLANT
PAD ARMBOARD 7.5X6 YLW CONV (MISCELLANEOUS) ×3 IMPLANT
PAD DANNIFLEX CPM (ORTHOPEDIC SUPPLIES) ×3 IMPLANT
PIN TROCAR 3 INCH (PIN) ×3 IMPLANT
SET BASIN LINEN APH (SET/KITS/TRAYS/PACK) ×3 IMPLANT
SET HNDPC FAN SPRY TIP SCT (DISPOSABLE) ×1 IMPLANT
SPONGE DRAIN TRACH 4X4 STRL 2S (GAUZE/BANDAGES/DRESSINGS) ×3 IMPLANT
STAPLER VISISTAT 35W (STAPLE) ×3 IMPLANT
SUT BRALON NAB BRD #1 30IN (SUTURE) ×5 IMPLANT
SUT ETHILON 3 0 FSL (SUTURE) ×2 IMPLANT
SUT MON AB 0 CT1 (SUTURE) ×3 IMPLANT
SUT MON AB 2-0 CT1 36 (SUTURE) IMPLANT
SYR 20CC LL (SYRINGE) IMPLANT
SYR 30ML LL (SYRINGE) ×3 IMPLANT
SYR BULB IRRIGATION 50ML (SYRINGE) ×3 IMPLANT
TOWEL OR 17X26 4PK STRL BLUE (TOWEL DISPOSABLE) ×3 IMPLANT
TOWER CARTRIDGE SMART MIX (DISPOSABLE) ×3 IMPLANT
TRAY FOLEY CATH 14FR (SET/KITS/TRAYS/PACK) ×2 IMPLANT
TRAY FOLEY CATH 16FR SILVER (SET/KITS/TRAYS/PACK) ×1 IMPLANT
WATER STERILE IRR 1000ML POUR (IV SOLUTION) ×12 IMPLANT
YANKAUER SUCT 12FT TUBE ARGYLE (SUCTIONS) ×3 IMPLANT

## 2013-10-08 NOTE — Transfer of Care (Signed)
Immediate Anesthesia Transfer of Care Note  Patient: Leonard Lindsey  Procedure(s) Performed: Procedure(s): TOTAL KNEE ARTHROPLASTY (Right)  Patient Location: PACU  Anesthesia Type:Spinal  Level of Consciousness: awake, alert , oriented and patient cooperative  Airway & Oxygen Therapy: Patient Spontanous Breathing and Patient connected to nasal cannula oxygen  Post-op Assessment: Report given to PACU RN and Post -op Vital signs reviewed and stable  Post vital signs: Reviewed and stable  Complications: No apparent anesthesia complications

## 2013-10-08 NOTE — Anesthesia Postprocedure Evaluation (Signed)
  Anesthesia Post-op Note  Patient: Leonard Lindsey  Procedure(s) Performed: Procedure(s): TOTAL KNEE ARTHROPLASTY (Right)  Patient Location: PACU  Anesthesia Type:Spinal  Level of Consciousness: awake, alert , oriented and patient cooperative  Airway and Oxygen Therapy: Patient Spontanous Breathing and Patient connected to nasal cannula oxygen  Post-op Pain: none  Post-op Assessment: Post-op Vital signs reviewed, Patient's Cardiovascular Status Stable, Respiratory Function Stable, Patent Airway, No signs of Nausea or vomiting and Pain level controlled  Post-op Vital Signs: Reviewed and stable  Last Vitals:  Filed Vitals:   10/08/13 1244  BP: 120/49  Pulse:   Temp: 36.4 C  Resp: 21    Complications: No apparent anesthesia complications

## 2013-10-08 NOTE — Anesthesia Procedure Notes (Addendum)
Date/Time: 10/08/2013 9:55 AM Performed by: Andree Elk, AMY A Pre-anesthesia Checklist: Patient identified, Timeout performed, Emergency Drugs available, Suction available and Patient being monitored Patient Re-evaluated:Patient Re-evaluated prior to inductionOxygen Delivery Method: Simple face mask   Spinal  Patient location during procedure: OR Start time: 10/08/2013 10:21 AM Staffing CRNA/Resident: ADAMS, AMY A Preanesthetic Checklist Completed: patient identified, site marked, surgical consent, pre-op evaluation, timeout performed, IV checked, risks and benefits discussed and monitors and equipment checked Spinal Block Patient position: right lateral decubitus Prep: Betadine Patient monitoring: heart rate, cardiac monitor, continuous pulse ox and blood pressure Approach: right paramedian Location: L3-4 Injection technique: single-shot Needle Needle type: Spinocan  Needle gauge: 22 G Needle length: 9 cm Assessment Sensory level: T8 Additional Notes ATTEMPTS:1 TRAY [id number] TRAY EXPIRATION DATE:02/2015 Bupivacaine 15mg , fentanyl 78mcg, epi.1 injected intrathecally at 1021; patient tolerated well.

## 2013-10-08 NOTE — Brief Op Note (Signed)
10/08/2013  12:43 PM  PATIENT:  Lyanne Co  80 y.o. male  PRE-OPERATIVE DIAGNOSIS:  Osteoarthritis right knee  POST-OPERATIVE DIAGNOSIS:  Osteoarthritis right knee  FINDINGS: SEVERE OA; BIPARTITE PATELLA, JOINT MICE   PROCEDURE:  Procedure(s): TOTAL KNEE ARTHROPLASTY (Right) DEPUY 60F 5T 10 POLY 41 P   SURGEON:  Surgeon(s) and Role:    * Carole Civil, MD - Primary  PHYSICIAN ASSISTANT:   ASSISTANTS: HOLLY AND BETTY    ANESTHESIA:   spinal  EBL:  Total I/O In: 1600 [I.V.:1600] Out: 550 [Urine:500; Blood:50]  BLOOD ADMINISTERED:none  DRAINS: (1) Hemovact drain(s) in the JOINT with  Suction Open   LOCAL MEDICATIONS USED:  BUPIVICAINE   SPECIMEN:  No Specimen  DISPOSITION OF SPECIMEN:  N/A  COUNTS:  YES  TOURNIQUET:   Total Tourniquet Time Documented: Thigh (Right) - 91 minutes Total: Thigh (Right) - 91 minutes   DICTATION: .Viviann Spare Dictation  PLAN OF CARE: Admit to inpatient   PATIENT DISPOSITION:  PACU - hemodynamically stable.   Delay start of Pharmacological VTE agent (>24hrs) due to surgical blood loss or risk of bleeding: yes

## 2013-10-08 NOTE — Progress Notes (Signed)
Pt forgot and removed his blood bracelet,  Repeated blood work for Type and screen and prepare 2 units

## 2013-10-08 NOTE — Interval H&P Note (Signed)
History and Physical Interval Note:  10/08/2013 9:41 AM  Leonard Lindsey  has presented today for surgery, with the diagnosis of Osteoarthritis right knee  The various methods of treatment have been discussed with the patient and family. After consideration of risks, benefits and other options for treatment, the patient has consented to  Procedure(s): TOTAL KNEE ARTHROPLASTY (Right) as a surgical intervention .  The patient's history has been reviewed, patient examined, no change in status, stable for surgery.  I have reviewed the patient's chart and labs.  Questions were answered to the patient's satisfaction.     Carole Civil

## 2013-10-08 NOTE — Anesthesia Preprocedure Evaluation (Addendum)
Anesthesia Evaluation  Patient identified by MRN, date of birth, ID band Patient awake    Reviewed: Allergy & Precautions, H&P , NPO status , Patient's Chart, lab work & pertinent test results  Airway Mallampati: II TM Distance: >3 FB Neck ROM: Full    Dental  (+) Upper Dentures, Lower Dentures   Pulmonary asthma ,    Pulmonary exam normal       Cardiovascular hypertension, Pt. on medications + Peripheral Vascular Disease Rhythm:Regular Rate:Normal     Neuro/Psych CVA negative psych ROS   GI/Hepatic   Endo/Other  Hypothyroidism   Renal/GU      Musculoskeletal  (+) Arthritis -, Osteoarthritis,    Abdominal Normal abdominal exam  (+)   Peds  Hematology   Anesthesia Other Findings   Reproductive/Obstetrics                          Anesthesia Physical Anesthesia Plan  ASA: III  Anesthesia Plan: Spinal   Post-op Pain Management:    Induction:   Airway Management Planned: Nasal Cannula  Additional Equipment:   Intra-op Plan:   Post-operative Plan:   Informed Consent: I have reviewed the patients History and Physical, chart, labs and discussed the procedure including the risks, benefits and alternatives for the proposed anesthesia with the patient or authorized representative who has indicated his/her understanding and acceptance.     Plan Discussed with: CRNA  Anesthesia Plan Comments: (IV sedation)        Anesthesia Quick Evaluation

## 2013-10-09 LAB — CBC
HEMATOCRIT: 36.3 % — AB (ref 39.0–52.0)
HEMOGLOBIN: 12.1 g/dL — AB (ref 13.0–17.0)
MCH: 29.7 pg (ref 26.0–34.0)
MCHC: 33.3 g/dL (ref 30.0–36.0)
MCV: 89 fL (ref 78.0–100.0)
Platelets: 186 10*3/uL (ref 150–400)
RBC: 4.08 MIL/uL — AB (ref 4.22–5.81)
RDW: 14 % (ref 11.5–15.5)
WBC: 9.2 10*3/uL (ref 4.0–10.5)

## 2013-10-09 LAB — BASIC METABOLIC PANEL
BUN: 16 mg/dL (ref 6–23)
CHLORIDE: 102 meq/L (ref 96–112)
CO2: 28 meq/L (ref 19–32)
CREATININE: 0.97 mg/dL (ref 0.50–1.35)
Calcium: 7.9 mg/dL — ABNORMAL LOW (ref 8.4–10.5)
GFR calc Af Amer: 89 mL/min — ABNORMAL LOW (ref >90)
GFR calc non Af Amer: 76 mL/min — ABNORMAL LOW (ref >90)
Glucose, Bld: 110 mg/dL — ABNORMAL HIGH (ref 70–99)
Potassium: 4 mEq/L (ref 3.7–5.3)
Sodium: 138 mEq/L (ref 137–147)

## 2013-10-09 MED ORDER — MUPIROCIN 2 % EX OINT
1.0000 "application " | TOPICAL_OINTMENT | Freq: Two times a day (BID) | CUTANEOUS | Status: DC
  Administered 2013-10-09 – 2013-10-11 (×5): 1 via NASAL
  Filled 2013-10-09: qty 22

## 2013-10-09 MED ORDER — CHLORHEXIDINE GLUCONATE CLOTH 2 % EX PADS
6.0000 | MEDICATED_PAD | Freq: Every day | CUTANEOUS | Status: DC
  Administered 2013-10-10 – 2013-10-11 (×2): 6 via TOPICAL

## 2013-10-09 NOTE — Progress Notes (Signed)
Patient is having increasing confusion. Patient got up to side of bed and pulled Hemovac and IV out. Dressing applied to hemovac site and new IV placed. Ted hose, foot pumps, and ice packs reapplied. Attempted to reorient patient without success. Nursing staff called patients wife in attempt to reorient patient. Will continue to monitor patient.

## 2013-10-09 NOTE — Anesthesia Postprocedure Evaluation (Signed)
  Anesthesia Post-op Note  Patient: Leonard Lindsey  Procedure(s) Performed: Procedure(s): TOTAL KNEE ARTHROPLASTY (Right)  Patient Location: room 307  Anesthesia Type:Spinal  Level of Consciousness: awake, alert , oriented and patient cooperative  Airway and Oxygen Therapy: Patient Spontanous Breathing  Post-op Pain: 6 /10, moderate  Post-op Assessment: Post-op Vital signs reviewed, Patient's Cardiovascular Status Stable, Respiratory Function Stable, Patent Airway, No signs of Nausea or vomiting and Pain level controlled  Post-op Vital Signs: Reviewed and stable  Last Vitals:  Filed Vitals:   10/09/13 0556  BP: 116/67  Pulse: 75  Temp: 36.9 C  Resp: 20    Complications: No apparent anesthesia complications

## 2013-10-09 NOTE — Progress Notes (Signed)
Physical Therapy Treatment Patient Details Name: Leonard Lindsey MRN: [id number] DOB: 1932-11-17 Today's Date: 10/09/2013    History of Present Illness Pt is admitted for an elective right TKR due to end stage OA.  He is normally independent with all ADLs and lives with his wife who is at home all of the time.    PT Comments    Pt is seen after lunch for resumption of PT for therapeutic exercise and application of CPM.  Pt is appearing to be confused but cooperative.  Pain is reasonably well controlled.  He was able to achieve 7-60 degrees AA ROM of the right knee and is nearly independent in doing short arc quad.  CPM was applied to the RLE at 0-6- degrees and pt was very comfortable.  RN was asked to discontinue it between 7:45 and 9:45 this evening.  Pt was instructed in calling the RN if he has any further discomfort.  Follow Up Recommendations   HHPT     Equipment Recommendations    none   Recommendations for Other Services  OT     Precautions / Restrictions Precautions Precautions: Fall;Knee Restrictions Weight Bearing Restrictions: No    Mobility  Bed Mobility   Bed Mobility: Supine to Sit;Sit to Supine       Sit to supine: Mod assist      Transfers   Equipment used: Rolling walker (2 wheeled)   Sit to Stand: Min assist         General transfer comment: pt instructed in turning and sidestepping to the bed  Ambulation/Gait Ambulation/Gait assistance: Min assist Ambulation Distance (Feet): 3 Feet   Gait Pattern/deviations: Decreased stance time - right;Decreased step length - right         Stairs:  N/T                     Balance                                    Cognition Arousal/Alertness: Awake/alert Behavior During Therapy:  (confused) Overall Cognitive Status: Within Functional Limits for tasks assessed                      Exercises Total Joint Exercises Ankle Circles/Pumps: AROM;Both;10  reps;Supine Quad Sets: AROM;Both;10 reps;Supine Short Arc Quad: AAROM;10 reps;Supine;Right Heel Slides: AAROM;Right;10 reps;Supine                 Home Living Family/patient expects to be discharged to:: Private residence Living Arrangements: Spouse/significant other Available Help at Discharge: Family Type of Home: House Home Access: Stairs to enter   Home Layout: One level Home Equipment: Environmental consultant - 2 wheels;Cane - single point;Bedside commode;Wheelchair - manual      Prior Function Level of Independence: Independent          PT Goals (current goals can now be found in the care plan section) Acute Rehab PT Goals Patient Stated Goal: None stated Progress towards PT goals: Progressing toward goals          PT Plan Current plan remains appropriate                 End of Session   Activity Tolerance: Patient tolerated treatment well Patient left: in bed;with call bell/phone within reach;in CPM     Time: 1300-1349 PT Time Calculation (min): 49 min  Charges:  $Therapeutic Exercise: 8-22  mins                    G Codes:      Sable Feil 10-21-13, 2:05 PM

## 2013-10-09 NOTE — Progress Notes (Signed)
Physical Therapy Treatment Patient Details Name: Leonard Lindsey MRN: [id number] DOB: Nov 04, 1932 Today's Date: 10/09/2013    History of Present Illness Pt is admitted for an elective right TKR due to end stage OA.  He is normally independent with all ADLs and lives with his wife who is at home all of the time.    PT Comments    Pt is very fatigued from being up in the chair for 2 hours.  He was medicated for pain about 1 hour ago but still has significant pain (7-8).  He was instructed in sit to stand transfer and in stepping from chair to bed using the walker.  He needed mod assist to return to supine.  Fresh ice packs were applied to the pt's knee and I decided to let him rest, eat his lunch and let his knee pain subside before we try to do any more exercise and get him in the CPM.  Follow Up Recommendations  Home health PT     Equipment Recommendations  None recommended by PT    Recommendations for Other Services OT consult     Precautions / Restrictions Precautions Precautions: Fall;Knee Precaution Booklet Issued: No Precaution Comments: went to total joint class and has knee booklet Restrictions Weight Bearing Restrictions: No    Mobility  Bed Mobility Overal bed mobility: Needs Assistance Bed Mobility: Supine to Sit;Sit to Supine     Supine to sit: Mod assist Sit to supine: Mod assist   General bed mobility comments: needs assist to support RLE  Transfers Overall transfer level: Needs assistance Equipment used: Rolling walker (2 wheeled) Transfers: Sit to/from Stand Sit to Stand: Min assist         General transfer comment: pt instructed in turning and sidestepping to the bed  Ambulation/Gait Ambulation/Gait assistance: Min assist Ambulation Distance (Feet): 3 Feet Assistive device: Rolling walker (2 wheeled) Gait Pattern/deviations: Decreased stance time - right;Decreased step length - right   Gait velocity interpretation: Below normal speed for  age/gender General Gait Details: pt has a typical post surgery gait                     Balance Overall balance assessment: No apparent balance deficits (not formally assessed)                                  Cognition Arousal/Alertness: Awake/alert Behavior During Therapy: WFL for tasks assessed/performed Overall Cognitive Status: Within Functional Limits for tasks assessed                      Exercises Total Joint Exercises Ankle Circles/Pumps: AROM;10 reps;Supine;Both Quad Sets: AROM;Both;10 reps;Supine Short Arc Quad: AAROM;Right;10 reps;Supine Heel Slides: AAROM;Right;10 reps;Supine                 Home Living Family/patient expects to be discharged to:: Private residence Living Arrangements: Spouse/significant other Available Help at Discharge: Family Type of Home: House Home Access: Stairs to enter Entrance Stairs-Rails: Right;Left;Can reach both Home Layout: One level Home Equipment: Environmental consultant - 2 wheels;Cane - single point;Bedside commode;Wheelchair - manual      Prior Function Level of Independence: Independent          PT Goals (current goals can now be found in the care plan section) Acute Rehab PT Goals Patient Stated Goal: return to independence, pain free knee PT Goal Formulation: With patient Time For Goal Achievement:  10/23/13 Potential to Achieve Goals: Good    Frequency  7X/week    PT Plan  See pt after lunch, perform TKR exercise per protocol and place in CPM set at 0-60 degrees.                 End of Session Equipment Utilized During Treatment: Gait belt Activity Tolerance: Patient tolerated treatment well Patient left: in chair;with call bell/phone within reach     Time: 0855-0940 PT Time Calculation (min): 45 min  Charges:  $Therapeutic Exercise: 8-22 mins                    G Codes:      Sable Feil 2013/11/08, 12:30 PM

## 2013-10-09 NOTE — Progress Notes (Signed)
Subjective: POD 1 RT TKA   Objective: BP 116/67  Pulse 75  Temp(Src) 98.4 F (36.9 C) (Oral)  Resp 20  Ht 5\' 9"  (1.753 m)  Wt 213 lb 13.5 oz (97 kg)  BMI 31.57 kg/m2  SpO2 94% Intake/Output from previous day: 04/29 0701 - 04/30 0700 In: 2330 [P.O.:240; I.V.:2000; IV Piggyback:50] Out: 1920 [Urine:1800; Blood:120] Intake/Output this shift:     Recent Labs  10/09/13 0500  HGB 12.1*    Recent Labs  10/09/13 0500  WBC 9.2  RBC 4.08*  HCT 36.3*  PLT 186    Recent Labs  10/09/13 0500  NA 138  K 4.0  CL 102  CO2 28  BUN 16  CREATININE 0.97  GLUCOSE 110*  CALCIUM 7.9*   No results found for this basename: LABPT, INR,  in the last 72 hours  Neurologically intact Neurovascular intact Sensation intact distally Intact pulses distally Dorsiflexion/Plantar flexion intact Compartment soft  Assessment/Plan: Therapy  pain mngment     Carole Civil 10/09/2013, 7:42 AM

## 2013-10-09 NOTE — Addendum Note (Signed)
Addendum created 10/09/13 0102 by Charmaine Downs, CRNA   Modules edited: Notes Section   Notes Section:  File: [id number]

## 2013-10-09 NOTE — Care Management Utilization Note (Signed)
UR completed 

## 2013-10-09 NOTE — Clinical Social Work Note (Signed)
Referred to CSW today for ?SNF. Chart reviewed and have spoken with patient who is quite adamant he not go to SNF at d/c- he plans to dc home with his wife. Wife and son at bedside- will follow along to further assess progress and dc needs. RNCM advised of patient's preference for dc to home with [facility] and DME.  Eduard Clos, MSW, [place]

## 2013-10-09 NOTE — Evaluation (Signed)
Occupational Therapy Evaluation Patient Details Name: Leonard Lindsey MRN: [id number] DOB: [birth year] Today's Date: 10/09/2013    History of Present Illness Pt is admitted for an elective right TKR due to end stage OA.  He is normally independent with all ADLs and lives with his wife who is at home all of the time.   Clinical Impression   Pt is presenting to acute OT with above situation.  He is currently requiring max assist for lower body ADLs. Further qassessment is needed for evaluation of functional mobility during ADL tasks.  At this time, pt will benefit from [facility] skilled OT services.    Follow Up Recommendations  Home health OT    Equipment Recommendations       Recommendations for Other Services       Precautions / Restrictions Precautions Precautions: Fall;Knee Precaution Booklet Issued: No Precaution Comments: went to total joint class and has knee booklet Restrictions Weight Bearing Restrictions: No      Mobility Bed Mobility Overal bed mobility: Needs Assistance Bed Mobility: Supine to Sit;Sit to Supine     Supine to sit: Mod assist Sit to supine: Mod assist   General bed mobility comments: needs assist to support RLE  Transfers Overall transfer level: Needs assistance Equipment used: Rolling walker (2 wheeled) Transfers: Sit to/from Stand Sit to Stand: Min assist         General transfer comment: pt instructed in turning and sidestepping to the bed    Balance Overall balance assessment: No apparent balance deficits (not formally assessed)                                          ADL Overall ADL's : Needs assistance/impaired             Lower Body Bathing: Maximal assistance       Lower Body Dressing: Maximal assistance                 General ADL Comments: pt is having increased in pain in righ tlower extremity - further assessement needed of functional transfers.  Educated pt on use of lower extremity dressing  techniques.     Vision                     Perception     Praxis      Pertinent Vitals/Pain Pt in 5/10 pain in knee. Recommended to pt and family to continued asking for pain meds as needed.     Hand Dominance Right   Extremity/Trunk Assessment Upper Extremity Assessment Upper Extremity Assessment: Overall WFL for tasks assessed (Baseline difficulty opposing to 5th digit, bilaterally.  Left grip stronger than right.)   Lower Extremity Assessment Lower Extremity Assessment: Defer to PT evaluation RLE Deficits / Details: AA ROM of knee =7-35 degrees, significant knee weakness due to surgery...moderate edema in knee.......Marland Kitchenhip and ankle are WNL       Communication Communication Communication: No difficulties   Cognition Arousal/Alertness: Awake/alert Behavior During Therapy: WFL for tasks assessed/performed Overall Cognitive Status: Within Functional Limits for tasks assessed                     General Comments       Exercises       Shoulder Instructions      Home Living Family/patient expects to be discharged to:: Private residence Living Arrangements:  Spouse/significant other Available Help at Discharge: Family Type of Home: House Home Access: Stairs to enter [employer] of Steps: 2 Entrance Stairs-Rails: Right;Left;Can reach both Home Layout: One level     Bathroom Shower/Tub: Teacher, early years/pre: Handicapped height     Home Equipment: Environmental consultant - 2 wheels;Cane - single point;Bedside commode;Wheelchair - manual          Prior Functioning/Environment Level of Independence: Independent             OT Diagnosis: Generalized weakness;Acute pain   OT Problem List: Decreased activity tolerance;Decreased knowledge of use of DME or AE;Pain   OT Treatment/Interventions: Self-care/ADL training;DME and/or AE instruction;Therapeutic activities;Balance training;Patient/family education    OT Goals(Current goals can  be found in the care plan section) Acute Rehab OT Goals Patient Stated Goal: None stated OT Goal Formulation: With patient Time For Goal Achievement: 10/23/13 Potential to Achieve Goals: Good  OT Frequency: Min 2X/week   Barriers to D/C:            Co-evaluation              End of Session    Activity Tolerance: Patient tolerated treatment well Patient left: in bed;with family/visitor present   Time: 1700-1749 OT Time Calculation (min): 17 min Charges:  OT General Charges $OT Visit: 1 Procedure OT Evaluation $Initial OT Evaluation Tier I: 1 Procedure G-Codes:     Bea Graff, MS, OTR/L [phone]  10/09/2013, 1:28 PM

## 2013-10-09 NOTE — Evaluation (Signed)
Physical Therapy Evaluation Patient Details Name: Leonard Lindsey MRN: [id number] DOB: 03-06-1933 Today's Date: 10/09/2013   History of Present Illness  Pt is admitted for an elective right TKR due to end stage OA.  He is normally independent with all ADLs and lives with his wife who is at home all of the time.  Clinical Impression   Pt is seen for eval/tx.  He is very pleasant and cooperative, pain fairly well controlled.  His right knee is moderately edematous.  He was instructed in TKR precautions and then TKR exercises were initiated.  He had 7-35 degrees AA ROM of the right knee.  He was able to step from bed to chair using a walker, WBAT right.  We will try to coordinate PT visit to coincide more closely with administration of pain med (PT was 3 hours post meds).  He should be able to transition to home at d/c.    Follow Up Recommendations Home health PT    Equipment Recommendations  None recommended by PT    Recommendations for Other Services OT consult     Precautions / Restrictions Precautions Precautions: Knee;Fall Precaution Booklet Issued: No Precaution Comments: went to total joint class and has knee booklet Restrictions Weight Bearing Restrictions: No      Mobility  Bed Mobility Overal bed mobility: Needs Assistance Bed Mobility: Supine to Sit     Supine to sit: Mod assist     General bed mobility comments: needs assist to support RLE  Transfers Overall transfer level: Needs assistance Equipment used: Rolling walker (2 wheeled) Transfers: Sit to/from Stand Sit to Stand: Min assist            Ambulation/Gait Ambulation/Gait assistance: Min assist Ambulation Distance (Feet): 2 Feet Assistive device: Rolling walker (2 wheeled) Gait Pattern/deviations: Decreased stance time - right;Decreased step length - right   Gait velocity interpretation: Below normal speed for age/gender General Gait Details: pt has a typical post surgery gait  Stairs:  N/T                    Balance Overall balance assessment: No apparent balance deficits (not formally assessed)                                             Home Living Family/patient expects to be discharged to:: Private residence Living Arrangements: Spouse/significant other Available Help at Discharge: Family Type of Home: House Home Access: Stairs to enter Entrance Stairs-Rails: Right;Left;Can reach both Entrance Stairs-Number of Steps: 2 Home Layout: One level Home Equipment: Environmental consultant - 2 wheels;Cane - single point;Bedside commode;Wheelchair - manual      Prior Function Level of Independence: Independent                       Extremity/Trunk Assessment               Lower Extremity Assessment: RLE deficits/detail RLE Deficits / Details: AA ROM of knee =7-35 degrees, significant knee weakness due to surgery...moderate edema in knee.......Marland Kitchenhip and ankle are WNL       Communication   Communication: No difficulties  Cognition Arousal/Alertness: Awake/alert Behavior During Therapy: WFL for tasks assessed/performed Overall Cognitive Status: Within Functional Limits for tasks assessed  Exercises Total Joint Exercises Ankle Circles/Pumps: AROM;10 reps;Supine;Both Quad Sets: AROM;Both;10 reps;Supine Short Arc Quad: AAROM;Right;10 reps;Supine Heel Slides: AAROM;Right;10 reps;Supine      Assessment/Plan    PT Assessment Patient needs continued PT services  PT Diagnosis Abnormality of gait;Difficulty walking;Acute pain   PT Problem List Decreased strength;Decreased range of motion;Decreased activity tolerance;Decreased mobility;Decreased knowledge of use of DME;Decreased knowledge of precautions;Decreased safety awareness;Pain  PT Treatment Interventions DME instruction;Gait training;Stair training;Functional mobility training;Therapeutic exercise;Patient/family education   PT Goals (Current goals  can be found in the Care Plan section) Acute Rehab PT Goals Patient Stated Goal: return to independence, pain free knee PT Goal Formulation: With patient Time For Goal Achievement: 10/23/13 Potential to Achieve Goals: Good    Frequency 7X/week   Barriers to discharge   none                   End of Session Equipment Utilized During Treatment: Gait belt Activity Tolerance: Patient tolerated treatment well Patient left: in chair;with call bell/phone within reach Nurse Communication: Mobility status         Time: 9892-1194 PT Time Calculation (min): 45 min   Charges:   PT Evaluation $Initial PT Evaluation Tier I: 1 Procedure PT Treatments $Therapeutic Exercise: 8-22 mins   PT G Codes:          Sable Feil 10/09/2013, 9:55 AM

## 2013-10-09 NOTE — Op Note (Signed)
Author: Carole Civil, MD Service: Orthopedics Author Type: Physician     Filed: 10/08/2013 12:47 PM Note Time: 10/08/2013 12:43 PM Status: Signed    Editor: Carole Civil, MD (Physician)        10/08/2013  12:43 PM  PATIENT:  Leonard Lindsey  80 y.o. male  PRE-OPERATIVE DIAGNOSIS:  Osteoarthritis right knee  POST-OPERATIVE DIAGNOSIS:  Osteoarthritis right knee  FINDINGS: SEVERE OA; BIPARTITE PATELLA, JOINT MICE   PROCEDURE:  Procedure(s): TOTAL KNEE ARTHROPLASTY (Right) DEPUY 2F 5T 10 POLY 41 P   SURGEON:  Surgeon(s) and Role:    * Carole Civil, MD - Primary  PHYSICIAN ASSISTANT:   ASSISTANTS: HOLLY AND BETTY    ANESTHESIA:   spinal  EBL:  Total I/O In: 1600 [I.V.:1600] Out: 550 [Urine:500; Blood:50]  BLOOD ADMINISTERED:none  DRAINS: (1) Hemovact drain(s) in the JOINT with  Suction Open   LOCAL MEDICATIONS USED:  BUPIVICAINE   SPECIMEN:  No Specimen  DISPOSITION OF SPECIMEN:  N/A  COUNTS:  YES  TOURNIQUET:    Total Tourniquet Time Documented: Thigh (Right) - 91 minutes Total: Thigh (Right) - 91 minutes   DICTATION: .Viviann Spare Dictation  PLAN OF CARE: Admit to inpatient   PATIENT DISPOSITION:  PACU - hemodynamically stable.   Delay start of Pharmacological VTE agent (>24hrs) due to surgical blood loss or risk of bleeding: yes   Preop diagnosis osteoarthritis right  knee Postop diagnosis same Procedure  right total knee arthroplasty Surgeon Aline Brochure Assisted by betty Caryl Pina and [facility] proztek Anesthesia spinal  Tourniquet time 90 minutes, pressure 300 mm of mercury  Indications for procedure disabling knee pain, failure to control pain with nonoperative measures  Details of procedure:  In the preop area the patient's right  knee was marked and countersigned by the surgeon, the chart was updated, consent was signed  The patient was taken to the operating room for spinal anesthetic followed by administering  Ancef based on weight of 97  kg  A Foley catheter was inserted sterilely, then the operative extremity  was prepped and draped sterilely  The timeout was completed  The limb was then exsanguinated with a six-inch Esmarch with the knee in flexion and the tourniquet was elevated to 300 mm of mercury. A midline incision was made, the subcutaneous tissues were divided down to the extensor mechanism. A medial arthrotomy was performed, the patella was everted,  the fat pad was resected. The medial and  lateral menisci were resected. The medial soft tissue sleeve was elevated to the mid coronal plane. The anterior cruciate ligament and PCL were resected. Osteophytes were removed. The distal femur anterior surface was skeletonized with sharp dissection.  A three-eighths inch drill bit was used to enter the femoral canal which was suctioned and irrigated until the fluid was clear;  an intramedullary rod was placed in the femur with a 5 right 79mm setting, the block was pinned in place; then 11 mm distal femoral resection was performed. The cut was checked for flatness.  The sizing guide was then placed on the femur, the femur measured a size 5; the block was pinned in external rotation 3 using the epicondyles as reference; a  4-in-1 cutting block was placed and the 4 cuts were made with retractors protecting the collateral ligaments. The posterior osteophytes were removed with a curved osteotome. Residual PCL tissue was resected; residual meniscal tissue was resected.  The external tibial alignment instrument was set for tibial resection. The guide was   placed  referencing the medial side which was the worn side and the stylus was set at 2 mm resection. Anterior slope was built in to match the patients anatomy; a neutral varus valgus cut was set using the medial third of the tibial tubercle as reference along with the medial portion of the lateral tibial spine. The guide was stabilized with pins.  A saw was used to resect the anterior  tibia. The tibia was sized to a size 5 base plate.  We then balanced the knee using spacer blocks and laminar spreaders. The knee was stable and balanced in flexion and extension  with a 10 spacer.   The size 5 box cutting guide was placed to resect the bone for the post. We then turned our attention to the patella.  The patella measured 26 mm in thickness; we set the guide to leave 16 mm of patella.  After resection of the patella,  It was remeasured, and measured 15 mm. I sized the patella to a  41. We then drilled the 3 peg holes.   We then did a trial reduction. The trial reduction confirmed that the knee  balanced in extension and balance in flexion. Passive flexion equalled 105, and patella tracking was normal.  The tibial rotation was set avoiding internal rotation, allowing congruency with the femur.  We then punched the tibia per technique with a 5 base plate.   The bone was then irrigated and dried while cement was mixed on the back table. The implants were checked for accuracy and then cemented in place; excess cement was removed; the cement was allowed to cure. The wound was then irrigated with copious amounts of saline, the posterior capsule was injected with 30 cc of Marcaine with epinephrine followed by 30 cc in the soft tissue. The 10 insert was placed. The range of motion and stability matched trial reduction. We then resected  The bipartite portion of the patella  The capsule was closed with #1 Bralon in interrupted and running fashion and then the joint was injected with 30 cc of Marcaine with epinephrine.  The subcutaneous tissues were closed with  0 Monocryl  in running fashion  Staples were used to reapproximate the skin edges.  Sterile dressings were applied.   The patient was then taken to the recovery room in stable condition.  Routine postop plan for knee replacement.

## 2013-10-10 ENCOUNTER — Inpatient Hospital Stay (HOSPITAL_COMMUNITY): Payer: Medicare Other

## 2013-10-10 ENCOUNTER — Encounter (HOSPITAL_COMMUNITY): Payer: Self-pay | Admitting: Orthopedic Surgery

## 2013-10-10 LAB — TYPE AND SCREEN
ABO/RH(D): A POS
Antibody Screen: NEGATIVE
UNIT DIVISION: 0
Unit division: 0

## 2013-10-10 LAB — CBC
HCT: 33.2 % — ABNORMAL LOW (ref 39.0–52.0)
Hemoglobin: 11.2 g/dL — ABNORMAL LOW (ref 13.0–17.0)
MCH: 29.7 pg (ref 26.0–34.0)
MCHC: 33.7 g/dL (ref 30.0–36.0)
MCV: 88.1 fL (ref 78.0–100.0)
Platelets: 169 10*3/uL (ref 150–400)
RBC: 3.77 MIL/uL — ABNORMAL LOW (ref 4.22–5.81)
RDW: 13.9 % (ref 11.5–15.5)
WBC: 9.8 10*3/uL (ref 4.0–10.5)

## 2013-10-10 MED ORDER — HYDROCODONE-ACETAMINOPHEN 7.5-325 MG PO TABS
1.0000 | ORAL_TABLET | ORAL | Status: DC | PRN
  Administered 2013-10-10: 1 via ORAL
  Filled 2013-10-10: qty 1

## 2013-10-10 MED ORDER — HALOPERIDOL 0.5 MG PO TABS
0.5000 mg | ORAL_TABLET | Freq: Three times a day (TID) | ORAL | Status: DC | PRN
  Filled 2013-10-10: qty 1

## 2013-10-10 NOTE — Clinical Social Work Placement (Addendum)
     Clinical Social Work Department CLINICAL SOCIAL WORK PLACEMENT NOTE 10/13/2013  Patient:  Leonard Lindsey, Leonard Lindsey  Account Number:  [id number] Admit date:  10/08/2013  Clinical Social Worker:  Edwyna Shell, CLINICAL SOCIAL WORKER  Date/time:  10/10/2013 04:00 PM  Clinical Social Work is seeking post-discharge placement for this patient at the following level of care:   SKILLED NURSING   (*CSW will update this form in Epic as items are completed)   10/10/2013  Patient/family provided with [place] Department of Clinical Social Works list of facilities offering this level of care within the geographic area requested by the patient (or if unable, by the patients family).  10/10/2013  Patient/family informed of their freedom to choose among providers that offer the needed level of care, that participate in Medicare, Medicaid or managed care program needed by the patient, have an available bed and are willing to accept the patient.  10/10/2013  Patient/family informed of [facility] ownership interest in [facility], as well as of the fact that they are under no obligation to receive care at this facility.  PASARR submitted to EDS on 10/10/2013 PASARR number received from EDS on 10/10/2013  FL2 transmitted to all facilities in geographic area requested by pt/family on  10/10/2013 FL2 transmitted to all facilities within larger geographic area on   Patient informed that his/her managed care company has contracts with or will negotiate with  certain facilities, including the following:     Patient/family informed of bed offers received:  10/10/2013 Patient chooses bed at [facility] Physician recommends and patient chooses bed at    Patient to be transferred to [facility] on  10/11/2013 Patient to be transferred to facility by [facility] staff via tunnel  The following physician request were entered in Epic:   Additional Comments:

## 2013-10-10 NOTE — Progress Notes (Signed)
Occupational Therapy Treatment Patient Details Name: Leonard Lindsey MRN: [id number] DOB: 04/05/1933 Today's Date: 10/10/2013    History of present illness Pt became very confused last night per RN report.  He is alert and oriented at this time. Pain is 5/10.   OT comments  Pt required min assist for bed mobility and sit>stand, and min guard assist for safety and impulsivity during stand pivot to chair.  Pt declined further education about lower body ADL techniques, verbalizing comfort with his wife assisting.  All other goals met - pt does not need further OT services at this time.      Follow Up Recommendations  No OT follow up (pt declined need for further education on lower extremity dressign techniques.)    Equipment Recommendations       Recommendations for Other Services      Precautions / Restrictions Precautions Precautions: Knee;Fall Restrictions Weight Bearing Restrictions: No       Mobility Bed Mobility         Supine to sit: Min guard     General bed mobility comments: pt able to control movement of the RLE with verbal cues  Transfers              Balance                                   ADL Overall ADL's : Needs assistance/impaired Eating/Feeding: Set up   Grooming: Independent       Lower Body Bathing: Moderate assistance   Upper Body Dressing : Independent   Lower Body Dressing: Moderate assistance   Toilet Transfer: Minimal assistance Toilet Transfer Details (indicate cue type and reason): pt transferred bed>chair with min assist for sit>stand, simulating toilet transfer. Toileting- Clothing Manipulation and Hygiene: Independent Toileting - Clothing Manipulation Details (indicate cue type and reason): pt utilized urinal with independnece.     Functional mobility during ADLs: Minimal assistance General ADL Comments: pt engaged in functional stand pivot transfer with min assist for safety.  he declined furhter education  on lower extremity deressing techniques at this time, verbalizing comfort with wife's ability to assist as needed.      Vision                     Perception     Praxis      Cognition   Behavior During Therapy: WFL for tasks assessed/performed Overall Cognitive Status: Within Functional Limits for tasks assessed                       Extremity/Trunk Assessment               Exercises   Shoulder Instructions       General Comments      Pertinent Vitals/ Pain       Pt in 5/10 knee pain  Home Living                                          Prior Functioning/Environment              Frequency       Progress Toward Goals  OT Goals(current goals can now be found in the care plan section)  Progress towards OT goals: Goals met/education completed, patient discharged from  OT  Acute Rehab OT Goals Patient Stated Goal: None stated OT Goal Formulation: With patient Time For Goal Achievement: 10/23/13 Potential to Achieve Goals: Good  Plan Discharge plan needs to be updated    Co-evaluation                 End of Session Equipment Utilized During Treatment: Rolling walker   Activity Tolerance Patient tolerated treatment well   Patient Left in chair;with call bell/phone within reach;with chair alarm set   Nurse Communication          Time: 2820-6015 OT Time Calculation (min): 18 min  Charges: OT General Charges $OT Visit: 1 Procedure OT Treatments $Self Care/Home Management : 8-22 mins  Bea Graff, MS, OTR/L [phone]  10/10/2013, 12:19 PM

## 2013-10-10 NOTE — Clinical Social Work Note (Signed)
Patient and wife given bed offers, chose bed at [facility].  [facility] can take patient Sat or Sun, depending on discharge plan.  Family and facility notified.  CSW contacting MD to notify.  Edwyna Shell, LCSW Clinical Social Worker [phone])

## 2013-10-10 NOTE — Progress Notes (Addendum)
Physical Therapy Treatment Patient Details Name: DEMARCUS THIELKE MRN: [id number] DOB: 1932/09/11 Today's Date: Oct 27, 2013    History of Present Illness Pt became very confused last night per RN report.  He is alert and oriented this AM.    PT Comments    Pt is upset by his confusion last night.  He has significant improvement in overall functional level.  ROM=7-60 degrees AA of right knee, able to do short arc quad with min assist.  He was instructed in gait with a walker, WBAT right.  He was able to ambulate 55' with a walker and no pain.    Follow Up Recommendations   HHPT     Equipment Recommendations    none   Recommendations for Other Services  OT     Precautions / Restrictions Precautions Precautions: Knee;Fall Restrictions Weight Bearing Restrictions: No    Mobility  Bed Mobility         Supine to sit: Min guard     General bed mobility comments: pt able to control movement of the RLE with verbal cues  Transfers   Equipment used: Rolling walker (2 wheeled) Transfers: Sit to/from Stand Sit to Stand: Min assist         General transfer comment: cues for anterior weight shift for standing  Ambulation/Gait Ambulation/Gait assistance: Min guard Ambulation Distance (Feet): 80 Feet Assistive device: Rolling walker (2 wheeled) Gait Pattern/deviations: Decreased step length - right;Decreased stance time - right   Gait velocity interpretation: at or above normal speed for age/gender     Stairs:  NT                                                     Cognition Arousal/Alertness: Awake/alert Behavior During Therapy: WFL for tasks assessed/performed Overall Cognitive Status: Within Functional Limits for tasks assessed                      Exercises Total Joint Exercises Ankle Circles/Pumps: AROM;Both;10 reps;Supine Quad Sets: AROM;Both;10 reps;Supine Short Arc Quad: AROM;AAROM;Right;10 reps;Supine Heel Slides:  AAROM;Right;10 reps;Supine Goniometric ROM: Right knee ROM=  7-60 degrees AA                                                PT Goals (current goals can now be found in the care plan section) Progress towards PT goals: Progressing toward goals          PT Plan Current plan remains appropriate                 End of Session Equipment Utilized During Treatment: Gait belt Activity Tolerance: Patient tolerated treatment well Patient left: in chair;with call bell/phone within reach;with chair alarm set     Time: 8527-7824 PT Time Calculation (min): 30 min  Charges:  $Gait Training: 8-22 mins $Therapeutic Exercise: 8-22 mins                    G Codes:      Sable Feil Oct 27, 2013, 9:50 AM

## 2013-10-10 NOTE — Progress Notes (Signed)
Physical Therapy Treatment Patient Details Name: Leonard Lindsey MRN: [id number] DOB: 06-18-1932 Today's Date: 10/10/2013    History of Present Illness Pt was only able to tolerate sitting in a chair for about 1 hour this AM.  He stated that he became very tired and needed to lie down.    PT Comments    Pt confusion appears to have resolved and he is progressing slowly.  He still, however, needs mod assist to transfer into the bed and gait this afternoon was quite slow and labored with several rests needed.  He did achieve 7-80 degrees of right knee ROM.  Pt's wife was present during my visit this afternoon.  Her health is not as good as the pt indicated it was on initial eval---she actually will not be able to assist him at all at home.  Pt even has to assist her with doing the cooking.  I am now very concerned that he might not transition well to home given his age, slow progress with transfers, 3 steps into the house with no railing (he is not yet ready to attempt gait on steps) and wife's poor health.  The patient has been wanting to go home at d/c and I am afraid that he glosses over how he is really feeling.  I discussed my concerns with pt and his wife and they would be agreeable to SNF if MD approves.  Follow Up Recommendations  SNF     Equipment Recommendations    none   Recommendations for Other Services  none     Precautions / Restrictions Precautions Precautions: Knee;Fall Restrictions Weight Bearing Restrictions: No    Mobility  Bed Mobility           Sit to supine: Mod assist   General bed mobility comments: pt continues to need assist to lift RLE into the bed...he was instructed in using the LLE to assist but he is unable to hook the left foot behind the right  Transfers Overall transfer level: Needs assistance Equipment used: Rolling walker (2 wheeled)   Sit to Stand: Min assist         General transfer comment: cues for proper hand  placement  Ambulation/Gait Ambulation/Gait assistance: Min assist Ambulation Distance (Feet): 60 Feet Assistive device: Rolling walker (2 wheeled) Gait Pattern/deviations: Decreased step length - left;Decreased stance time - right   Gait velocity interpretation: Below normal speed for age/gender  Pt initially had very unsteady gait pattern for about 15' and needed increased stabilization by therapist   Stairs:  N/T  Not ready                                                         Cognition Arousal/Alertness: Awake/alert Behavior During Therapy: WFL for tasks assessed/performed Overall Cognitive Status: Within Functional Limits for tasks assessed                      Exercises Total Joint Exercises Ankle Circles/Pumps: AROM;Both;10 reps;Supine Quad Sets: AROM;Both;10 reps;Supine Short Arc Quad: AROM;AAROM;Right;10 reps;Supine Heel Slides: AAROM;Right;10 reps;Supine Goniometric ROM: Right knee ROM= 7-80 degrees              Home Living  independent at home with wife  PT Goals (current goals can now be found in the care plan section) Acute Rehab PT Goals Patient Stated Goal: None stated           PT Plan Discharge plan needs to be updated                 End of Session Equipment Utilized During Treatment: Gait belt Activity Tolerance: Patient limited by fatigue Patient left: in bed;in CPM     Time: 1349-1450 PT Time Calculation (min): 61 min  Charges:  $Gait Training: 8-22 mins $Therapeutic Exercise: 8-22 mins $Therapeutic Activity: 8-22 mins                    G Codes:      Sable Feil 2013/11/08, 3:17 PM

## 2013-10-10 NOTE — Clinical Social Work Psychosocial (Signed)
Clinical Social Work Department BRIEF PSYCHOSOCIAL ASSESSMENT 10/10/2013  Patient:  Leonard Lindsey, Leonard Lindsey     Account Number:  [id number]     Admit date:  10/08/2013  Clinical Social Worker:  Edwyna Shell, [place]  Date/Time:  10/10/2013 03:30 PM  Referred by:  Physician  Date Referred:  10/10/2013 Referred for  SNF Placement   Other Referral:   Interview type:  Patient Other interview type:   Also spoke w wife and son in room    PSYCHOSOCIAL DATA Living Status:  WIFE Admitted from facility:   Level of care:   Primary support name:  Leonard Lindsey Primary support relationship to patient:  SPOUSE Degree of support available:   Lives w wife at home, wife has Parkinsons and has limited ability to help patient, son lives nearby    CURRENT CONCERNS Current Concerns  Post-Acute Placement   Other Concerns:    SOCIAL WORK ASSESSMENT / PLAN CSW met w patient in room, patient alert and oriented x4. Is post op day 2 from total knee replacement.  Lives w wife in [place], originally from [place], moved here to be near son.  Patient has been independent and able to care for himself and assist his wife w housekeeping and cooking chores.  At present, he is unable to complete his usual activities due to rehab from surgery.  He has told PT that his wife is fully able to care for him; however, it appears that she is not able to assist him w transfers to/from bed and chair.  Patient insists he is "able to get up, I feel stronger now."  However, PT's evaluation recommends placement in SNF for short term rehab to regain strength.    Patient wants to be close to his wife who does not drive (per wife, she has driven only a few times in the last few years).  "If I cannot be in [place], I do not want to go to SNF."  Wants to do outpatient and home PT if SNF does not work out as he  hopes.  Is reluctantly agreeable to a very short course of rehab at facility in [place] only, feels he  has caregiving responsibilities for his wife and prefers to be home as soon as possible.    CSW explained placement process, copays possible and placement options.  Contacted [facility] as this appears to be their first choice (wanted "facility near where we go to outpatient PT").   Assessment/plan status:  Psychosocial Support/Ongoing Assessment of Needs Other assessment/ plan:   Information/referral to community resources:   SNF list    PATIENT'S/FAMILY'S RESPONSE TO PLAN OF CARE: Patient reluctantly agreeable to short term SNF placement, wants to be home as soon as possible, appears concerned for his wife who has Parkinsons.        Edwyna Shell, LCSW Clinical Social Worker [phone])

## 2013-10-10 NOTE — Progress Notes (Signed)
Subjective: 2 Days Post-Op Procedure(s) (LRB): TOTAL KNEE ARTHROPLASTY (Right) Patient reports pain as mild.  To moderate   Objective: Vital signs in last 24 hours: Temp:  [97.8 F (36.6 C)] 97.8 F (36.6 C) (05/01 0556) Pulse Rate:  [75-84] 84 (05/01 0556) Resp:  [20] 20 (05/01 0556) BP: (144-149)/(65-72) 149/72 mmHg (05/01 0556) SpO2:  [94 %-95 %] 94 % (05/01 0556)  Intake/Output from previous day: 04/30 0701 - 05/01 0700 In: 1920 [P.O.:720; I.V.:1200] Out: 1135 [Urine:875; Drains:260] Intake/Output this shift:     Recent Labs  10/09/13 0500 10/10/13 0457  HGB 12.1* 11.2*    Recent Labs  10/09/13 0500 10/10/13 0457  WBC 9.2 9.8  RBC 4.08* 3.77*  HCT 36.3* 33.2*  PLT 186 169    Recent Labs  10/09/13 0500  NA 138  K 4.0  CL 102  CO2 28  BUN 16  CREATININE 0.97  GLUCOSE 110*  CALCIUM 7.9*   No results found for this basename: LABPT, INR,  in the last 72 hours  Neurologically intact Neurovascular intact Sensation intact distally Intact pulses distally Dorsiflexion/Plantar flexion intact Incision: dressing C/D/I No cellulitis present  Assessment/Plan: 2 Days Post-Op Procedure(s) (LRB): TOTAL KNEE ARTHROPLASTY (Right) Discharge to SNF tomorrow   Leonard Lindsey 10/10/2013, 8:35 AM

## 2013-10-11 ENCOUNTER — Inpatient Hospital Stay
Admission: RE | Admit: 2013-10-11 | Discharge: 2013-10-13 | Disposition: A | Discharge status: Other Acute Inpatient Hospital | Payer: Medicare Other | Source: Ambulatory Visit | Attending: Family Medicine | Admitting: Family Medicine

## 2013-10-11 LAB — CBC
HCT: 32 % — ABNORMAL LOW (ref 39.0–52.0)
HEMOGLOBIN: 10.7 g/dL — AB (ref 13.0–17.0)
MCH: 29.9 pg (ref 26.0–34.0)
MCHC: 33.4 g/dL (ref 30.0–36.0)
MCV: 89.4 fL (ref 78.0–100.0)
Platelets: 172 10*3/uL (ref 150–400)
RBC: 3.58 MIL/uL — ABNORMAL LOW (ref 4.22–5.81)
RDW: 13.5 % (ref 11.5–15.5)
WBC: 8.5 10*3/uL (ref 4.0–10.5)

## 2013-10-11 MED ORDER — DSS 100 MG PO CAPS: 100.0000 mg | ORAL_CAPSULE | Freq: Two times a day (BID) | ORAL | Status: DC

## 2013-10-11 MED ORDER — BISACODYL 10 MG RE SUPP: 10.0000 mg | Freq: Every day | RECTAL | Status: DC | PRN

## 2013-10-11 MED ORDER — HYDROCODONE-ACETAMINOPHEN 7.5-325 MG PO TABS: 1.0000 | ORAL_TABLET | ORAL | Status: DC | PRN

## 2013-10-11 NOTE — Discharge Summary (Signed)
Physician Discharge Summary  Patient ID: Leonard Lindsey MRN: [id number] DOB/AGE: 12-18-1932 81 y.o.  Admit date: 10/08/2013 Discharge date: 10/11/2013  Admission Diagnoses:oa right knee   Discharge Diagnoses: oa right knee  Active Problems:   OA (osteoarthritis) of knee   Discharged Condition: good  Hospital Course: normal course except confusion at night   Consults: None  Significant Diagnostic Studies: labs:  Hemoglobin & Hematocrit     Component Value Date/Time   HGB 10.7* 10/11/2013 0558   HCT 32.0* 10/11/2013 0558     Treatments: surgery: rt tka  Discharge Exam: Blood pressure 142/63, pulse 81, temperature 98.3 F (36.8 C), temperature source Oral, resp. rate 18, height 5\' 9"  (1.753 m), weight 213 lb 13.5 oz (97 kg), SpO2 95.00%. Incision/Wound: clean   Disposition: 01-Home or Self Care  Discharge Orders   Future Appointments Provider Department Dept Phone   10/23/2013 2:45 PM Carole Civil, MD [facility] [phone]   Future Orders Complete By Expires   Call MD / Call 911  As directed    Change dressing  As directed    Constipation Prevention  As directed    CPM  As directed    Diet - low sodium heart healthy  As directed    Discharge instructions  As directed    Increase activity slowly as tolerated  As directed        Medication List         atorvastatin 40 MG tablet  Commonly known as:  LIPITOR  Take 40 mg by mouth at bedtime.     bisacodyl 10 MG suppository  Commonly known as:  DULCOLAX  Place 1 suppository (10 mg total) rectally daily as needed for moderate constipation.     CARTIA XT 180 MG 24 hr capsule  Generic drug:  diltiazem  Take 180 mg by mouth every morning.     clopidogrel 75 MG tablet  Commonly known as:  PLAVIX  Take 75 mg by mouth every morning.     cyanocobalamin 1000 MCG/ML injection  Commonly known as:  (VITAMIN B-12)  Inject 1,000 mcg into the muscle every 30 (thirty) days.     diclofenac sodium 1 % Gel  Commonly known as:  VOLTAREN  Apply 2 g topically 4 (four) times daily.     DSS 100 MG Caps  Take 100 mg by mouth 2 (two) times daily.     dutasteride 0.5 MG capsule  Commonly known as:  AVODART  Take 0.5 mg by mouth at bedtime.     HYDROcodone-acetaminophen 7.5-325 MG per tablet  Commonly known as:  NORCO  Take 1 tablet by mouth every 4 (four) hours as needed for moderate pain.     levothyroxine 137 MCG tablet  Commonly known as:  SYNTHROID, LEVOTHROID  Take 137 mcg by mouth every morning.     lidocaine 5 %  Commonly known as:  LIDODERM  Place 1 patch onto the skin 2 (two) times daily as needed (for pain). Remove & Discard patch within 12 hours or as directed by MD     ramipril 5 MG capsule  Commonly known as:  ALTACE  Take 5 mg by mouth every morning.     tamsulosin 0.4 MG Caps capsule  Commonly known as:  FLOMAX  Take 0.4 mg by mouth at bedtime.     triamterene-hydrochlorothiazide 37.5-25 MG per capsule  Commonly known as:  DYAZIDE  Take 1 capsule by mouth every morning.  Follow-up Information   Follow up with Arther Abbott, MD.   Specialties:  Orthopedic Surgery, Radiology   Contact information:   [address], [address] [place], [place] [address] [phone]      Follow up pod 12-15 Signed: Carole Civil 10/11/2013, 10:02 AM

## 2013-10-11 NOTE — Progress Notes (Signed)
Patient making good progress towards the goals  , plan to dc to SNF today and continue with rehab to be able to dc home safely.

## 2013-10-11 NOTE — Progress Notes (Signed)
Patient report called to [facility] at [facility]. Patient discharged to [facility] in stable condition.

## 2013-10-12 LAB — TYPE AND SCREEN
ABO/RH(D): A POS
Antibody Screen: NEGATIVE
UNIT DIVISION: 0
Unit division: 0

## 2013-10-13 ENCOUNTER — Encounter (HOSPITAL_COMMUNITY): Payer: Self-pay | Admitting: Emergency Medicine

## 2013-10-13 ENCOUNTER — Emergency Department (HOSPITAL_COMMUNITY): Payer: Medicare Other

## 2013-10-13 ENCOUNTER — Emergency Department (HOSPITAL_COMMUNITY)
Admission: EM | Admit: 2013-10-13 | Discharge: 2013-10-13 | Disposition: A | Discharge status: Home / Self Care | Payer: Medicare Other | Attending: Emergency Medicine | Admitting: Emergency Medicine

## 2013-10-13 ENCOUNTER — Inpatient Hospital Stay
Admission: RE | Admit: 2013-10-13 | Discharge: 2013-10-24 | Disposition: A | Discharge status: Home Health | Payer: Medicare Other | Source: Ambulatory Visit | Attending: Family Medicine | Admitting: Family Medicine

## 2013-10-13 DIAGNOSIS — I2510 Atherosclerotic heart disease of native coronary artery without angina pectoris: Secondary | ICD-10-CM | POA: Insufficient documentation

## 2013-10-13 DIAGNOSIS — M129 Arthropathy, unspecified: Secondary | ICD-10-CM | POA: Insufficient documentation

## 2013-10-13 DIAGNOSIS — J45909 Unspecified asthma, uncomplicated: Secondary | ICD-10-CM | POA: Insufficient documentation

## 2013-10-13 DIAGNOSIS — S1093XA Contusion of unspecified part of neck, initial encounter: Principal | ICD-10-CM

## 2013-10-13 DIAGNOSIS — E039 Hypothyroidism, unspecified: Secondary | ICD-10-CM | POA: Insufficient documentation

## 2013-10-13 DIAGNOSIS — Z79899 Other long term (current) drug therapy: Secondary | ICD-10-CM | POA: Insufficient documentation

## 2013-10-13 DIAGNOSIS — N400 Enlarged prostate without lower urinary tract symptoms: Secondary | ICD-10-CM | POA: Insufficient documentation

## 2013-10-13 DIAGNOSIS — Z96659 Presence of unspecified artificial knee joint: Secondary | ICD-10-CM | POA: Insufficient documentation

## 2013-10-13 DIAGNOSIS — Y9389 Activity, other specified: Secondary | ICD-10-CM | POA: Insufficient documentation

## 2013-10-13 DIAGNOSIS — I10 Essential (primary) hypertension: Secondary | ICD-10-CM | POA: Insufficient documentation

## 2013-10-13 DIAGNOSIS — I253 Aneurysm of heart: Secondary | ICD-10-CM | POA: Insufficient documentation

## 2013-10-13 DIAGNOSIS — W06XXXA Fall from bed, initial encounter: Secondary | ICD-10-CM | POA: Insufficient documentation

## 2013-10-13 DIAGNOSIS — Z8673 Personal history of transient ischemic attack (TIA), and cerebral infarction without residual deficits: Secondary | ICD-10-CM | POA: Insufficient documentation

## 2013-10-13 DIAGNOSIS — Y9210 Unspecified residential institution as the place of occurrence of the external cause: Secondary | ICD-10-CM | POA: Insufficient documentation

## 2013-10-13 DIAGNOSIS — S0003XA Contusion of scalp, initial encounter: Secondary | ICD-10-CM | POA: Insufficient documentation

## 2013-10-13 DIAGNOSIS — Z9889 Other specified postprocedural states: Secondary | ICD-10-CM | POA: Insufficient documentation

## 2013-10-13 DIAGNOSIS — W19XXXA Unspecified fall, initial encounter: Secondary | ICD-10-CM

## 2013-10-13 DIAGNOSIS — S0083XA Contusion of other part of head, initial encounter: Principal | ICD-10-CM | POA: Insufficient documentation

## 2013-10-13 DIAGNOSIS — Y92129 Unspecified place in nursing home as the place of occurrence of the external cause: Secondary | ICD-10-CM

## 2013-10-13 DIAGNOSIS — E782 Mixed hyperlipidemia: Secondary | ICD-10-CM | POA: Insufficient documentation

## 2013-10-13 MED ORDER — HYDROCODONE-ACETAMINOPHEN 5-325 MG PO TABS: 1.0000 | ORAL_TABLET | ORAL | Status: DC | PRN

## 2013-10-13 MED ORDER — TRAMADOL HCL 50 MG PO TABS: 50.0000 mg | ORAL_TABLET | Freq: Four times a day (QID) | ORAL | Status: AC | PRN

## 2013-10-13 NOTE — ED Notes (Signed)
Patient states that he was bleeding from right knee operative site and slipped and fell in the blood on the floor. Bandage to right knee has blood on it.

## 2013-10-13 NOTE — Discharge Instructions (Signed)
Your hydrocodone-acetaminophen 7.5-325 seems to be too strong for you. Your being given a prescription for tramadol which you can take for moderate pain and hydrocodone-acetaminophen 5-325 to take for pain not relieved by tramadol. If you continue to have side effects with these medications, if you'll have to work with your physician to find an analgesic that gives you relief without severe side effects.  You are taking clopidogrel (Plavix). This puts you at risk for delayed bleeding when you hit her head. If there are any signs of a significant head injury that appear in the next 24-48 hours, then return to the emergency department so we can repeat your CT scan.  Fall Prevention and Home Safety Falls cause injuries and can affect all age groups. It is possible to use preventive measures to significantly decrease the likelihood of falls. There are many simple measures which can make your home safer and prevent falls. OUTDOORS  Repair cracks and edges of walkways and driveways.  Remove high doorway thresholds.  Trim shrubbery on the main path into your home.  Have good outside lighting.  Clear walkways of tools, rocks, debris, and clutter.  Check that handrails are not broken and are securely fastened. Both sides of steps should have handrails.  Have leaves, snow, and ice cleared regularly.  Use sand or salt on walkways during winter months.  In the garage, clean up grease or oil spills. BATHROOM  Install night lights.  Install grab bars by the toilet and in the tub and shower.  Use non-skid mats or decals in the tub or shower.  Place a plastic non-slip stool in the shower to sit on, if needed.  Keep floors dry and clean up all water on the floor immediately.  Remove soap buildup in the tub or shower on a regular basis.  Secure bath mats with non-slip, double-sided rug tape.  Remove throw rugs and tripping hazards from the floors. BEDROOMS  Install night lights.  Make  sure a bedside light is easy to reach.  Do not use oversized bedding.  Keep a telephone by your bedside.  Have a firm chair with side arms to use for getting dressed.  Remove throw rugs and tripping hazards from the floor. KITCHEN  Keep handles on pots and pans turned toward the center of the stove. Use back burners when possible.  Clean up spills quickly and allow time for drying.  Avoid walking on wet floors.  Avoid hot utensils and knives.  Position shelves so they are not too high or low.  Place commonly used objects within easy reach.  If necessary, use a sturdy step stool with a grab bar when reaching.  Keep electrical cables out of the way.  Do not use floor polish or wax that makes floors slippery. If you must use wax, use non-skid floor wax.  Remove throw rugs and tripping hazards from the floor. STAIRWAYS  Never leave objects on stairs.  Place handrails on both sides of stairways and use them. Fix any loose handrails. Make sure handrails on both sides of the stairways are as long as the stairs.  Check carpeting to make sure it is firmly attached along stairs. Make repairs to worn or loose carpet promptly.  Avoid placing throw rugs at the top or bottom of stairways, or properly secure the rug with carpet tape to prevent slippage. Get rid of throw rugs, if possible.  Have an electrician put in a light switch at the top and bottom of the stairs.  OTHER FALL PREVENTION TIPS  Wear low-heel or rubber-soled shoes that are supportive and fit well. Wear closed toe shoes.  When using a stepladder, make sure it is fully opened and both spreaders are firmly locked. Do not climb a closed stepladder.  Add color or contrast paint or tape to grab bars and handrails in your home. Place contrasting color strips on first and last steps.  Learn and use mobility aids as needed. Install an electrical emergency response system.  Turn on lights to avoid dark areas. Replace light  bulbs that burn out immediately. Get light switches that glow.  Arrange furniture to create clear pathways. Keep furniture in the same place.  Firmly attach carpet with non-skid or double-sided tape.  Eliminate uneven floor surfaces.  Select a carpet pattern that does not visually hide the edge of steps.  Be aware of all pets. OTHER HOME SAFETY TIPS  Set the water temperature for 120 F (48.8 C).  Keep emergency numbers on or near the telephone.  Keep smoke detectors on every level of the home and near sleeping areas. Document Released: 05/19/2002 Document Revised: 11/28/2011 Document Reviewed: 08/18/2011 [facility] Patient Information 2014 [place].  Acetaminophen; Hydrocodone tablets or capsules What is this medicine? ACETAMINOPHEN; HYDROCODONE (a set a MEE noe fen; hye droe KOE done) is a pain reliever. It is used to treat mild to moderate pain. This medicine may be used for other purposes; ask your health care provider or pharmacist if you have questions. COMMON BRAND NAME(S): Anexsia, Bancap HC , Ceta-Plus, Co-Gesic, Comfortpak , Dolagesic, [employer], DuoCet , Hydrocet , Hydrogesic, Lorcet HD, Lorcet Plus, Lorcet, [place], Margesic H, Maxidone, [place], Polygesic, [place], [place], Vicodin ES, Vicodin HP, Vicodin, Charlane Ferretti What should I tell my health care provider before I take this medicine? They need to know if you have any of these conditions: -brain tumor -Crohn's disease, inflammatory bowel disease, or ulcerative colitis -drug abuse or addiction -head injury -heart or circulation problems -if you often drink alcohol -kidney disease or problems going to the bathroom -liver disease -lung disease, asthma, or breathing problems -an unusual or allergic reaction to acetaminophen, hydrocodone, other opioid analgesics, other medicines, foods, dyes, or preservatives -pregnant or trying to get pregnant -breast-feeding How should I use this medicine? Take this  medicine by mouth. Swallow it with a full glass of water. Follow the directions on the prescription label. If the medicine upsets your stomach, take the medicine with food or milk. Do not take more than you are told to take. Talk to your pediatrician regarding the use of this medicine in children. This medicine is not approved for use in children. Overdosage: If you think you have taken too much of this medicine contact a poison control center or emergency room at once. NOTE: This medicine is only for you. Do not share this medicine with others. What if I miss a dose? If you miss a dose, take it as soon as you can. If it is almost time for your next dose, take only that dose. Do not take double or extra doses. What may interact with this medicine? -alcohol -antihistamines -isoniazid -medicines for depression, anxiety, or psychotic disturbances -medicines for sleep -muscle relaxants -naltrexone -narcotic medicines (opiates) for pain -phenobarbital -ritonavir -tramadol This list may not describe all possible interactions. Give your health care provider a list of all the medicines, herbs, non-prescription drugs, or dietary supplements you use. Also tell them if you smoke, drink alcohol, or use illegal drugs. Some items  may interact with your medicine. What should I watch for while using this medicine? Tell your doctor or health care professional if your pain does not go away, if it gets worse, or if you have new or a different type of pain. You may develop tolerance to the medicine. Tolerance means that you will need a higher dose of the medicine for pain relief. Tolerance is normal and is expected if you take the medicine for a long time. Do not suddenly stop taking your medicine because you may develop a severe reaction. Your body becomes used to the medicine. This does NOT mean you are addicted. Addiction is a behavior related to getting and using a drug for a non-medical reason. If you have  pain, you have a medical reason to take pain medicine. Your doctor will tell you how much medicine to take. If your doctor wants you to stop the medicine, the dose will be slowly lowered over time to avoid any side effects. You may get drowsy or dizzy when you first start taking the medicine or change doses. Do not drive, use machinery, or do anything that may be dangerous until you know how the medicine affects you. Stand or sit up slowly. There are different types of narcotic medicines (opiates) for pain. If you take more than one type at the same time, you may have more side effects. Give your health care provider a list of all medicines you use. Your doctor will tell you how much medicine to take. Do not take more medicine than directed. Call emergency for help if you have problems breathing. The medicine will cause constipation. Try to have a bowel movement at least every 2 to 3 days. If you do not have a bowel movement for 3 days, call your doctor or health care professional. Too much acetaminophen can be very dangerous. Do not take Tylenol (acetaminophen) or medicines that contain acetaminophen with this medicine. Many non-prescription medicines contain acetaminophen. Always read the labels carefully. What side effects may I notice from receiving this medicine? Side effects that you should report to your doctor or health care professional as soon as possible: -allergic reactions like skin rash, itching or hives, swelling of the face, lips, or tongue -breathing problems -confusion -feeling faint or lightheaded, falls -stomach pain -yellowing of the eyes or skin Side effects that usually do not require medical attention (report to your doctor or health care professional if they continue or are bothersome): -nausea, vomiting -stomach upset This list may not describe all possible side effects. Call your doctor for medical advice about side effects. You may report side effects to FDA at  1-800-FDA-1088. Where should I keep my medicine? Keep out of the reach of children. This medicine can be abused. Keep your medicine in a safe place to protect it from theft. Do not share this medicine with anyone. Selling or giving away this medicine is dangerous and against the law. Store at room temperature between 15 and 30 degrees C (59 and 86 degrees F). Protect from light. Keep container tightly closed.  Throw away any unused medicine after the expiration date. Discard unused medicine and used packaging carefully. Pets and children can be harmed if they find used or lost packages. NOTE: This sheet is a summary. It may not cover all possible information. If you have questions about this medicine, talk to your doctor, pharmacist, or health care provider.  2014, Elsevier/Gold Standard. (2013-01-20 13:15:56)  Tramadol tablets What is this medicine? TRAMADOL (TRA ma  dole) is a pain reliever. It is used to treat moderate to severe pain in adults. This medicine may be used for other purposes; ask your health care provider or pharmacist if you have questions. COMMON BRAND NAME(S): Ultram What should I tell my health care provider before I take this medicine? They need to know if you have any of these conditions: -brain tumor -depression -drug abuse or addiction -head injury -if you frequently drink alcohol containing drinks -kidney disease or trouble passing urine -liver disease -lung disease, asthma, or breathing problems -seizures or epilepsy -suicidal thoughts, plans, or attempt; a previous suicide attempt by you or a family member -an unusual or allergic reaction to tramadol, codeine, other medicines, foods, dyes, or preservatives -pregnant or trying to get pregnant -breast-feeding How should I use this medicine? Take this medicine by mouth with a full glass of water. Follow the directions on the prescription label. If the medicine upsets your stomach, take it with food or milk. Do  not take more medicine than you are told to take. Talk to your pediatrician regarding the use of this medicine in children. Special care may be needed. Overdosage: If you think you have taken too much of this medicine contact a poison control center or emergency room at once. NOTE: This medicine is only for you. Do not share this medicine with others. What if I miss a dose? If you miss a dose, take it as soon as you can. If it is almost time for your next dose, take only that dose. Do not take double or extra doses. What may interact with this medicine? Do not take this medicine with any of the following medications: -MAOIs like Carbex, Eldepryl, Marplan, Nardil, and Parnate This medicine may also interact with the following medications: -alcohol or medicines that contain alcohol -antihistamines -benzodiazepines -bupropion -carbamazepine or oxcarbazepine -clozapine -cyclobenzaprine -digoxin -furazolidone -linezolid -medicines for depression, anxiety, or psychotic disturbances -medicines for migraine headache like almotriptan, eletriptan, frovatriptan, naratriptan, rizatriptan, sumatriptan, zolmitriptan -medicines for pain like pentazocine, buprenorphine, butorphanol, meperidine, nalbuphine, and propoxyphene -medicines for sleep -muscle relaxants -naltrexone -phenobarbital -phenothiazines like perphenazine, thioridazine, chlorpromazine, mesoridazine, fluphenazine, prochlorperazine, promazine, and trifluoperazine -procarbazine -warfarin This list may not describe all possible interactions. Give your health care provider a list of all the medicines, herbs, non-prescription drugs, or dietary supplements you use. Also tell them if you smoke, drink alcohol, or use illegal drugs. Some items may interact with your medicine. What should I watch for while using this medicine? Tell your doctor or health care professional if your pain does not go away, if it gets worse, or if you have new or a  different type of pain. You may develop tolerance to the medicine. Tolerance means that you will need a higher dose of the medicine for pain relief. Tolerance is normal and is expected if you take this medicine for a long time. Do not suddenly stop taking your medicine because you may develop a severe reaction. Your body becomes used to the medicine. This does NOT mean you are addicted. Addiction is a behavior related to getting and using a drug for a non-medical reason. If you have pain, you have a medical reason to take pain medicine. Your doctor will tell you how much medicine to take. If your doctor wants you to stop the medicine, the dose will be slowly lowered over time to avoid any side effects. You may get drowsy or dizzy. Do not drive, use machinery, or do anything that needs mental alertness  until you know how this medicine affects you. Do not stand or sit up quickly, especially if you are an older patient. This reduces the risk of dizzy or fainting spells. Alcohol can increase or decrease the effects of this medicine. Avoid alcoholic drinks. You may have constipation. Try to have a bowel movement at least every 2 to 3 days. If you do not have a bowel movement for 3 days, call your doctor or health care professional. Your mouth may get dry. Chewing sugarless gum or sucking hard candy, and drinking plenty of water may help. Contact your doctor if the problem does not go away or is severe. What side effects may I notice from receiving this medicine? Side effects that you should report to your doctor or health care professional as soon as possible: -allergic reactions like skin rash, itching or hives, swelling of the face, lips, or tongue -breathing difficulties, wheezing -confusion -itching -light headedness or fainting spells -redness, blistering, peeling or loosening of the skin, including inside the mouth -seizures Side effects that usually do not require medical attention (report to your  doctor or health care professional if they continue or are bothersome): -constipation -dizziness -drowsiness -headache -nausea, vomiting This list may not describe all possible side effects. Call your doctor for medical advice about side effects. You may report side effects to FDA at 1-800-FDA-1088. Where should I keep my medicine? Keep out of the reach of children. Store at room temperature between 15 and 30 degrees C (59 and 86 degrees F). Keep container tightly closed. Throw away any unused medicine after the expiration date. NOTE: This sheet is a summary. It may not cover all possible information. If you have questions about this medicine, talk to your doctor, pharmacist, or health care provider.  2014, Elsevier/Gold Standard. (2010-02-09 11:55:44)

## 2013-10-13 NOTE — ED Provider Notes (Signed)
CSN: [id number]     Arrival date & time 10/13/13  0600 History   First MD Initiated Contact with Patient 10/13/13 [phone]     Chief Complaint  Patient presents with  . Fall     (Consider location/radiation/quality/duration/timing/severity/associated sxs/prior Treatment) Patient is a 80 y.o. male presenting with fall. The history is provided by the patient.  Fall  He was transferred here from [facility] following a fall. He is 5 days postop right total knee arthroplasty. He states that he must have gotten out of bed but does not remember that. He knows that he fell and did hit his head. He is complaining of pain in the left occiput and he rates pain at 6/10. There's been no dizziness or vision change. There's been no nausea vomiting. There's been no weakness, numbness, tingling. He has had some bleeding from his incision. He apparently received a dose of hydrocodone-acetaminophen 7.5-325 before going to bed.  Past Medical History  Diagnosis Date  . Essential hypertension, benign   . Hypothyroid     Following remote treatment of hyperthyroidism with radioactive iodine  . Mixed hyperlipidemia   . Prostatism   . Carotid artery disease     56-43% LICA 3/29  . Atrial septal aneurysm     TEE 11/12 with positive buble study - no definite ASD  . Stroke     Prior history and more recent - likely embolic in rright occipital parietal lobe 11/12  . Hyperlipidemia   . Asthma   . BPH (benign prostatic hyperplasia)   . Arthritis    Past Surgical History  Procedure Laterality Date  . Abdominal surgery      Tumor removal  . Tee without cardioversion  05/09/2011    Procedure: TRANSESOPHAGEAL ECHOCARDIOGRAM (TEE);  Surgeon: Lelon Perla, MD;  Location: [facility] ENDOSCOPY;  Service: Cardiovascular;  Laterality: N/A;  . Prostate surgery    . Total knee arthroplasty Right 10/08/2013    Procedure: TOTAL KNEE ARTHROPLASTY;  Surgeon: Carole Civil, MD;  Location: [facility] ORS;  Service: Orthopedics;   Laterality: Right;   Family History  Problem Relation Age of Onset  . Anesthesia problems Neg Hx   . Hypotension Neg Hx   . Malignant hyperthermia Neg Hx   . Pseudochol deficiency Neg Hx    History  Substance Use Topics  . Smoking status: Never Smoker   . Smokeless tobacco: Never Used  . Alcohol Use: No    Review of Systems  All other systems reviewed and are negative.     Allergies  Review of patient's allergies indicates no known allergies.  Home Medications   Prior to Admission medications   Medication Sig Start Date End Date Taking? Authorizing Provider  atorvastatin (LIPITOR) 40 MG tablet Take 40 mg by mouth at bedtime.    Yes Historical Provider, MD  bisacodyl (DULCOLAX) 10 MG suppository Place 1 suppository (10 mg total) rectally daily as needed for moderate constipation. 10/11/13  Yes Carole Civil, MD  clopidogrel (PLAVIX) 75 MG tablet Take 75 mg by mouth every morning.    Yes Historical Provider, MD  cyanocobalamin (,VITAMIN B-12,) 1000 MCG/ML injection Inject 1,000 mcg into the muscle every 30 (thirty) days.     Yes Historical Provider, MD  diclofenac sodium (VOLTAREN) 1 % GEL Apply 2 g topically 4 (four) times daily. 09/12/12  Yes Carole Civil, MD  diltiazem (CARTIA XT) 180 MG 24 hr capsule Take 180 mg by mouth every morning.   Yes Historical Provider,  MD  docusate sodium 100 MG CAPS Take 100 mg by mouth 2 (two) times daily. 10/11/13  Yes Carole Civil, MD  dutasteride (AVODART) 0.5 MG capsule Take 0.5 mg by mouth at bedtime.    Yes Historical Provider, MD  HYDROcodone-acetaminophen (NORCO) 7.5-325 MG per tablet Take 1 tablet by mouth every 4 (four) hours as needed for moderate pain. 10/11/13  Yes Carole Civil, MD  levothyroxine (SYNTHROID, LEVOTHROID) 137 MCG tablet Take 137 mcg by mouth every morning.    Yes Historical Provider, MD  lidocaine (LIDODERM) 5 % Place 1 patch onto the skin 2 (two) times daily as needed (for pain). Remove & Discard patch  within 12 hours or as directed by MD   Yes Historical Provider, MD  ramipril (ALTACE) 5 MG capsule Take 5 mg by mouth every morning.   Yes Historical Provider, MD  Tamsulosin HCl (FLOMAX) 0.4 MG CAPS Take 0.4 mg by mouth at bedtime.    Yes Historical Provider, MD  triamterene-hydrochlorothiazide (DYAZIDE) 37.5-25 MG per capsule Take 1 capsule by mouth every morning.    Yes Historical Provider, MD   BP 139/71  Pulse 80  Temp(Src) 98.4 F (36.9 C) (Oral)  Resp 18  Ht 5' 9.5" (1.765 m)  Wt 215 lb (97.523 kg)  BMI 31.31 kg/m2  SpO2 96% Physical Exam  Nursing note and vitals reviewed.  80 year old male, resting comfortably and in no acute distress. Vital signs are normal. Oxygen saturation is 96%, which is normal. Head is normocephalic and atraumatic. PERRLA, EOMI. Oropharynx is clear. Neck is nontender without adenopathy or JVD. Back is nontender and there is no CVA tenderness. Lungs are clear without rales, wheezes, or rhonchi. Chest is nontender. Heart has regular rate and rhythm without murmur. Abdomen is soft, flat, nontender without masses or hepatosplenomegaly and peristalsis is normoactive. Extremities: Right knee incision has staples in place and is healing appropriately without any evidence of infection or dehiscent. There is some blood on the dressing but no active bleeding from the incision. There is moderate swelling around the knee appropriate for 5 days postoperative. Remainder of extremity exam is unremarkable. Skin is warm and dry without rash. Neurologic: Mental status is normal, cranial nerves are intact, there are no motor or sensory deficits.  ED Course  Procedures (including critical care time) Imaging Review Ct Head Wo Contrast  10/13/2013   CLINICAL DATA:  Fall.  Head trauma.  EXAM: CT HEAD WITHOUT CONTRAST  CT CERVICAL SPINE WITHOUT CONTRAST  TECHNIQUE: Multidetector CT imaging of the head and cervical spine was performed following the standard protocol without  intravenous contrast. Multiplanar CT image reconstructions of the cervical spine were also generated.  COMPARISON:  05/30/2013.  FINDINGS: CT HEAD FINDINGS  No mass lesion, mass effect, midline shift, hydrocephalus, hemorrhage. No acute territorial cortical ischemia/infarct. Atrophy and chronic ischemic white matter disease is present. More prominent chronic ischemic white matter disease in the right parietal lobe in the area of previously seen small infarctions. Mild right parietal encephalomalacia. Paranasal sinuses appear within normal limits. Opacification of a single left mastoid air cell.  CT CERVICAL SPINE FINDINGS  Severe cervical spondylosis and facet arthrosis is present. There is no cervical spine fracture. 3 mm anterolisthesis of C5 on C6 appears degenerative and facet mediated. 2 mm anterolisthesis of C4 on C5. 3 mm anterolisthesis of C6 on C7 and C7 on T1. At all levels, this appears facet mediated. Severe subchondral cystic changes are present in the facet joints. Intraosseous ganglion  is present in the posterior aspect of the odontoid with dense sclerosis of the anterior odontoid. There is no odontoid fracture. The occipital condyles appear intact. Craniocervical alignment is normal. Severe atlantodental degenerative disease. Large disc osteophyte complex is present at C5-C6 producing severe central stenosis. Severe multilevel foraminal stenosis is also present.  IMPRESSION: 1. No acute intracranial abnormality. Atrophy, chronic ischemic white matter disease and right parietal encephalomalacia. 2. No cervical spine fracture or convincing evidence of an acute osseous abnormality Severe multilevel degenerative disc and facet disease with likely degenerative facet mediated spondylolisthesis. The degree of facet arthrosis and erosion of the posterior odontoid suggests CPPD arthropathy. Gout arthritis can produce a similar appearance.   Electronically Signed   By: Dereck Ligas M.D.   On: 10/13/2013  07:08   Ct Cervical Spine Wo Contrast  10/13/2013   CLINICAL DATA:  Fall.  Head trauma.  EXAM: CT HEAD WITHOUT CONTRAST  CT CERVICAL SPINE WITHOUT CONTRAST  TECHNIQUE: Multidetector CT imaging of the head and cervical spine was performed following the standard protocol without intravenous contrast. Multiplanar CT image reconstructions of the cervical spine were also generated.  COMPARISON:  05/30/2013.  FINDINGS: CT HEAD FINDINGS  No mass lesion, mass effect, midline shift, hydrocephalus, hemorrhage. No acute territorial cortical ischemia/infarct. Atrophy and chronic ischemic white matter disease is present. More prominent chronic ischemic white matter disease in the right parietal lobe in the area of previously seen small infarctions. Mild right parietal encephalomalacia. Paranasal sinuses appear within normal limits. Opacification of a single left mastoid air cell.  CT CERVICAL SPINE FINDINGS  Severe cervical spondylosis and facet arthrosis is present. There is no cervical spine fracture. 3 mm anterolisthesis of C5 on C6 appears degenerative and facet mediated. 2 mm anterolisthesis of C4 on C5. 3 mm anterolisthesis of C6 on C7 and C7 on T1. At all levels, this appears facet mediated. Severe subchondral cystic changes are present in the facet joints. Intraosseous ganglion is present in the posterior aspect of the odontoid with dense sclerosis of the anterior odontoid. There is no odontoid fracture. The occipital condyles appear intact. Craniocervical alignment is normal. Severe atlantodental degenerative disease. Large disc osteophyte complex is present at C5-C6 producing severe central stenosis. Severe multilevel foraminal stenosis is also present.  IMPRESSION: 1. No acute intracranial abnormality. Atrophy, chronic ischemic white matter disease and right parietal encephalomalacia. 2. No cervical spine fracture or convincing evidence of an acute osseous abnormality Severe multilevel degenerative disc and facet  disease with likely degenerative facet mediated spondylolisthesis. The degree of facet arthrosis and erosion of the posterior odontoid suggests CPPD arthropathy. Gout arthritis can produce a similar appearance.   Electronically Signed   By: Dereck Ligas M.D.   On: 10/13/2013 07:08   Dg Knee Complete 4 Views Right  10/13/2013   CLINICAL DATA:  Right knee pain, recent knee replacement.  EXAM: RIGHT KNEE - COMPLETE 4+ VIEW  COMPARISON:  DG KNEE1- 2 VIEWS*R* dated 10/10/2013; DG KNEE*R*PORT dated 10/08/2013  FINDINGS: Status post total knee arthroplasty with intact well seated femoral and tibial components, no periprosthetic lucency. Expected appearance of the resurfaced patella. No acute fracture deformity or dislocation. Large suprapatellar joint effusion persist. Overlying skin staples.  IMPRESSION: Status post right knee total arthroplasty with large suprapatellar joint effusion, similar.   Electronically Signed   By: Elon Alas   On: 10/13/2013 07:01   MDM   Final diagnoses:  Fall at nursing home  Contusion of scalp    Fall with closed  head injury. He is neurologically intact but will be sent for CT of the head and cervical spine. His wife is concerned about his knee surgery, so the x-ray will be obtained. Old records are reviewed and he is not on any anticoagulants but he is on clopidogrel which does increase his risk for delayed bleeding. This is explained to patient and his family.  X-rays and CT scans are negative for acute injury and he is discharged. Since he seemed to have problems with hydrocodone-acetaminophen 7.5-325, he is discharged with prescriptions for tramadol and hydrocodone-acetaminophen 5-325. If he continues to have side effects of these medications, his physician at the nursing home we'll need to work with him to find an appropriate analgesic.  Delora Fuel, MD 93/[id number]/02 [id number]

## 2013-10-23 ENCOUNTER — Ambulatory Visit (INDEPENDENT_AMBULATORY_CARE_PROVIDER_SITE_OTHER): Payer: Self-pay | Admitting: Orthopedic Surgery

## 2013-10-23 VITALS — BP 148/68 | Ht 69.5 in | Wt 215.0 lb

## 2013-10-23 DIAGNOSIS — M1710 Unilateral primary osteoarthritis, unspecified knee: Secondary | ICD-10-CM

## 2013-10-23 DIAGNOSIS — Z96659 Presence of unspecified artificial knee joint: Secondary | ICD-10-CM

## 2013-10-23 DIAGNOSIS — M1711 Unilateral primary osteoarthritis, right knee: Secondary | ICD-10-CM

## 2013-10-23 DIAGNOSIS — IMO0002 Reserved for concepts with insufficient information to code with codable children: Secondary | ICD-10-CM

## 2013-10-23 NOTE — Progress Notes (Signed)
Patient ID: Leonard Lindsey, male   DOB: December 12, 1932, 80 y.o.   MRN: [id number]  Postop visit status post total knee arthroplasty Date of surgery  10/08/2013 Diagnosis  Osteoarthritis Operative findings osteoarthritis Implant Depuy  posterior stabilized total knee replacement DVT prophylaxis Ecotrin twice a day with TED hose for 6 weeks  Living situation [facility] Complaints none Exam plain wound, knee flexion 90 ambulating with cane Plan return home as soon as he is independent out of the bed  Followup in 4 weeks

## 2013-10-24 ENCOUNTER — Telehealth: Payer: Self-pay | Admitting: *Deleted

## 2013-10-24 NOTE — Telephone Encounter (Signed)
Santiago Glad from [facility] called and was wanting to know about Leonard Lindsey being discharged. I read to her Dr. Ruthe Mannan office note, which stated, " Plan return home as soon as he is independent out of the bed", which she has his order stating the same. She stated that PT did not feel patient was ready, which she said they had faxed in their note to Dr. Aline Brochure, prior to patient's office visit 10/23/13. I spoke with the patient's wife, and she stated that she was ready for him to come home, and she felt she could assist taking him to the restroom. Also,  that he is very adoment about coming home today. Santiago Glad was going to speak with her again.

## 2013-11-20 ENCOUNTER — Ambulatory Visit (INDEPENDENT_AMBULATORY_CARE_PROVIDER_SITE_OTHER): Payer: Self-pay | Admitting: Orthopedic Surgery

## 2013-11-20 DIAGNOSIS — Z96659 Presence of unspecified artificial knee joint: Secondary | ICD-10-CM

## 2013-11-20 MED ORDER — HYDROCODONE-ACETAMINOPHEN 5-325 MG PO TABS: 1.0000 | ORAL_TABLET | Freq: Four times a day (QID) | ORAL | Status: DC | PRN

## 2013-11-20 NOTE — Progress Notes (Signed)
Patient ID: Leonard Lindsey, male   DOB: 1933/06/11, 80 y.o.   MRN: [id number] Postop total knee followup postop visit number to 6 weeks and surgery  Patient doing well has 110 of knee flexion as a 3 flexion contracture he has excellent straight leg raise. He is angulating with a cane out of the house and without a cane in the house. He still has some postop confusion. We noted at the time of surgery that he may be getting some dementia but she is in denial  His wound looks good  Followup 6 weeks he can drive with his wife in the car

## 2014-01-01 ENCOUNTER — Ambulatory Visit (INDEPENDENT_AMBULATORY_CARE_PROVIDER_SITE_OTHER): Payer: Medicare Other | Admitting: Orthopedic Surgery

## 2014-01-01 VITALS — Ht 69.5 in | Wt 212.0 lb

## 2014-01-01 DIAGNOSIS — Z96651 Presence of right artificial knee joint: Secondary | ICD-10-CM

## 2014-01-01 DIAGNOSIS — Z96659 Presence of unspecified artificial knee joint: Secondary | ICD-10-CM

## 2014-01-01 NOTE — Progress Notes (Signed)
Patient ID: Leonard Lindsey, male   DOB: [birth year], 80 y.o.   MRN: [id number] Chief Complaint  Patient presents with  . Follow-up    6 week recheck on right knee replacement. DOS 10-08-13.    Ht 5' 9.5" (1.765 m)  Wt 212 lb (96.163 kg)  BMI 30.87 kg/m2  Postop visit status post total knee arthroplasty  Diagnosis  Osteoarthritis  Implant Depuy  posterior stabilized total knee replacement   Complaints none Exam normal skin incision, walks without assistive device. No swelling of the knee. He does have peripheral edema. Plan continue normal activities followup 3 months

## 2014-02-26 ENCOUNTER — Ambulatory Visit: Payer: Medicare Other | Admitting: Orthopedic Surgery

## 2014-03-26 ENCOUNTER — Ambulatory Visit: Payer: Medicare Other | Admitting: Orthopedic Surgery

## 2014-03-27 ENCOUNTER — Other Ambulatory Visit: Payer: Self-pay

## 2014-11-08 IMAGING — CT CT CHEST W/ CM
1 of 2 series · 15 of 32 positions shown, 19 images · IV contrast (75CC OMNI 300)
Comparison: 12/19/2011

CLINICAL DATA: Follow-up anterior mediastinal mass.

CT CHEST WITH CONTRAST
TECHNIQUE: Multidetector CT imaging of the chest was performed
following the standard protocol during bolus administration of
intravenous contrast.
Contrast: 75mL OMNIPAQUE IOHEXOL 300 MG/ML  SOLN

[Series 602: sagittal body · sagittal · 0.87mm/px · 15 of 180 slices shown, 19 images]
[im 7/180  mediastinal]
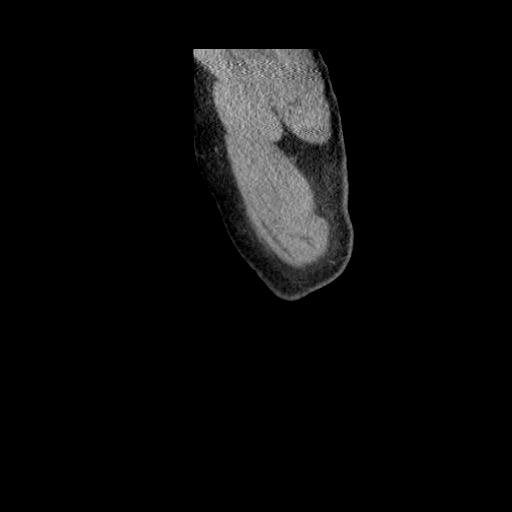
[im 7/180  lung]
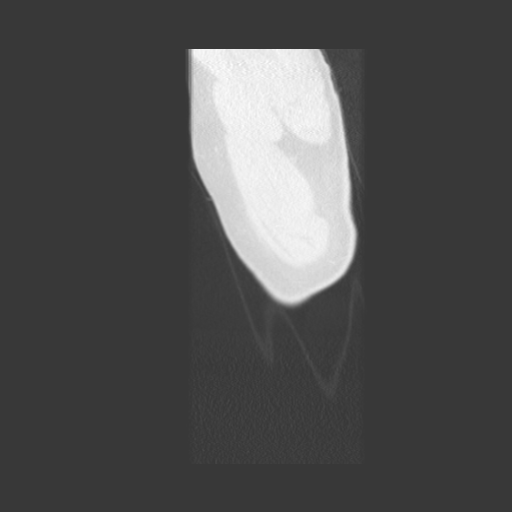
[im 20/180  lung]
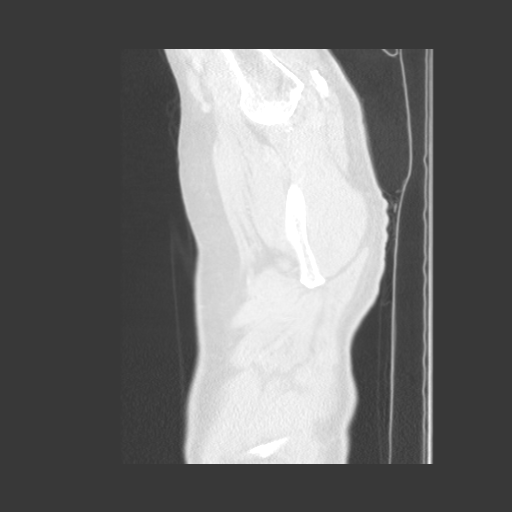
[im 34/180  lung]
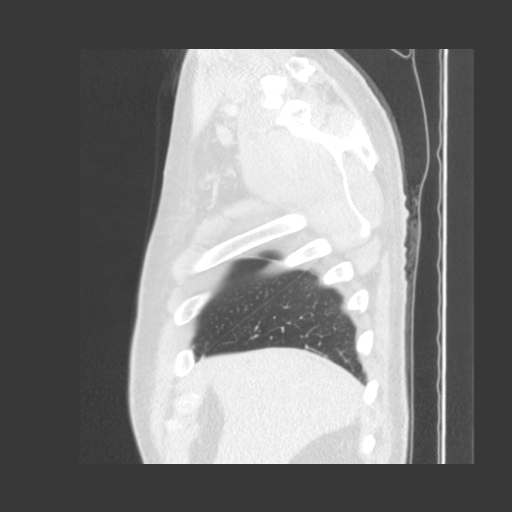
[im 47/180  lung]
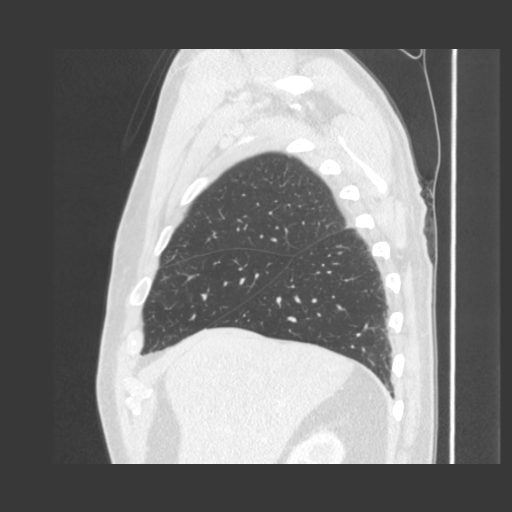
[im 60/180  mediastinal]
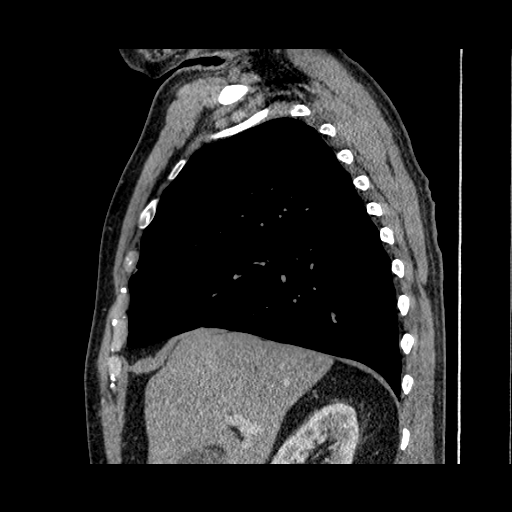
[im 60/180  lung]
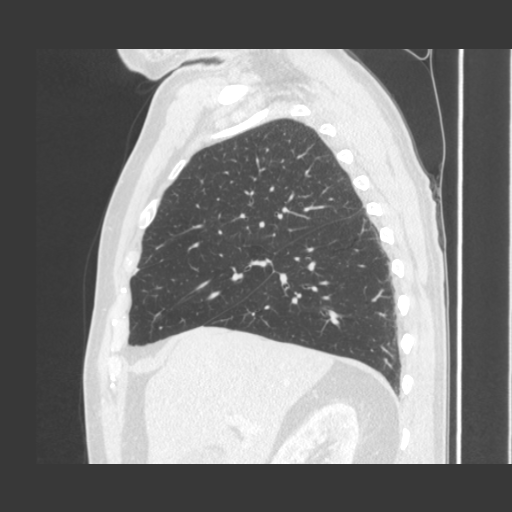
[im 67/180  lung]
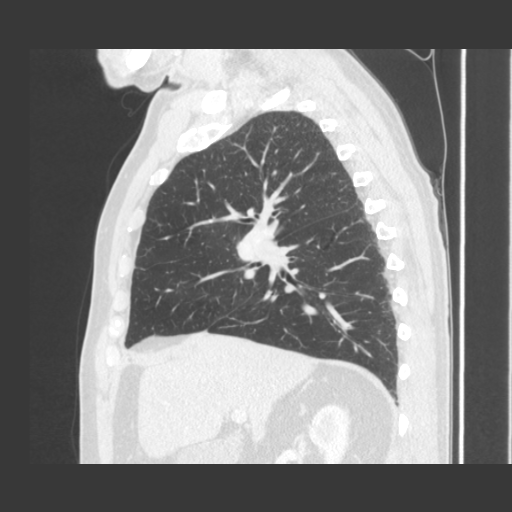
[im 80/180  lung]
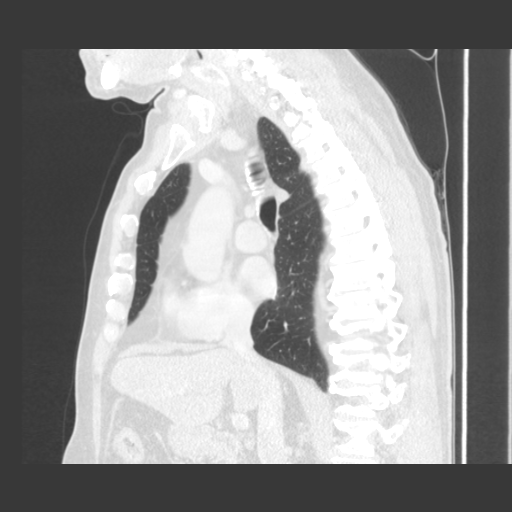
[im 90/180  lung]
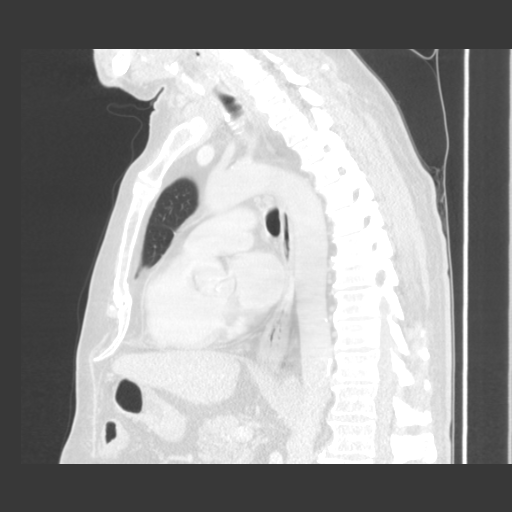
[im 100/180  mediastinal]
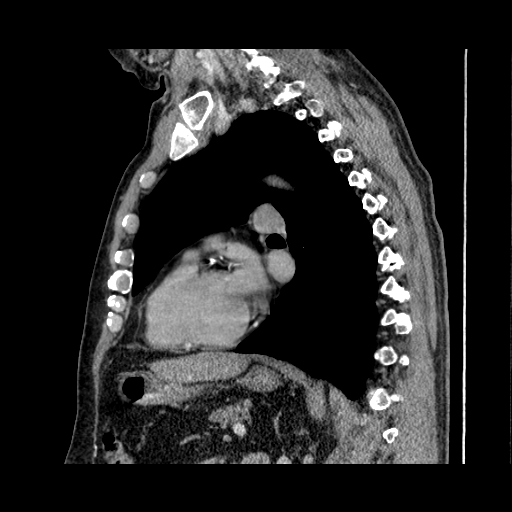
[im 100/180  lung]
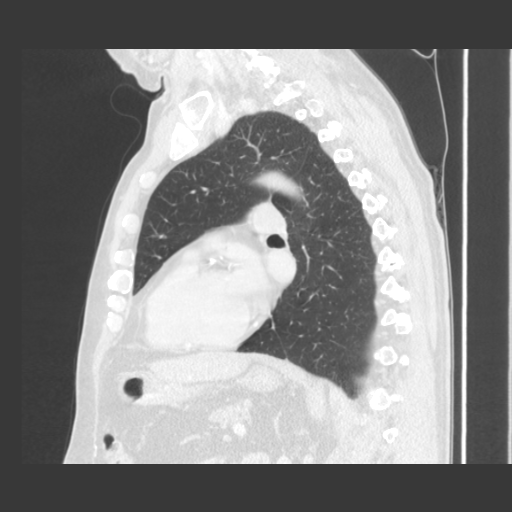
[im 113/180  lung]
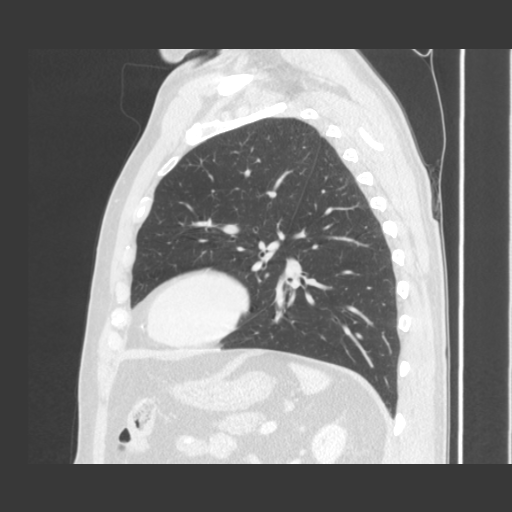
[im 120/180  lung]
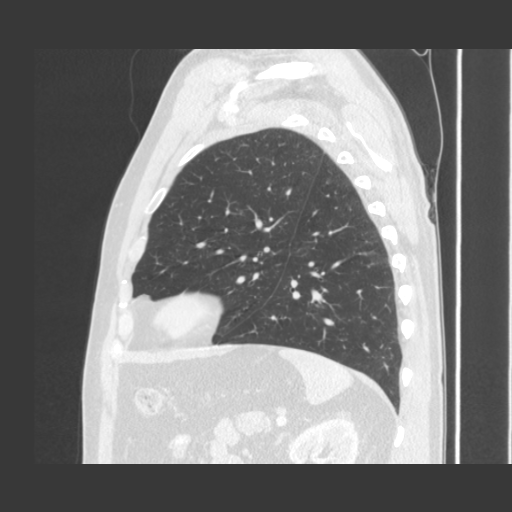
[im 133/180  lung]
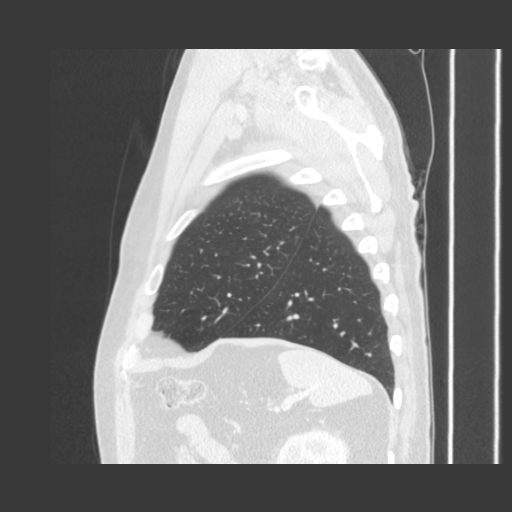
[im 146/180  mediastinal]
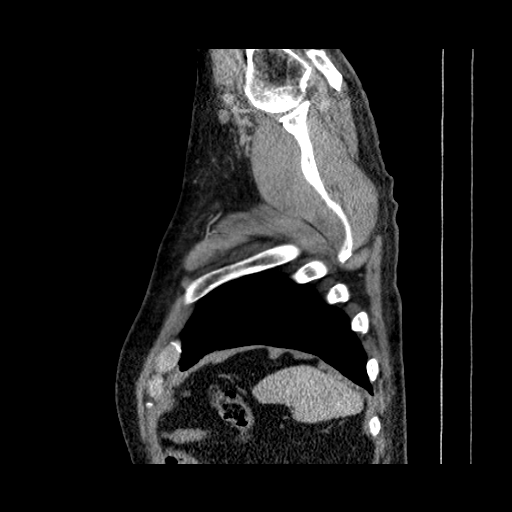
[im 146/180  lung]
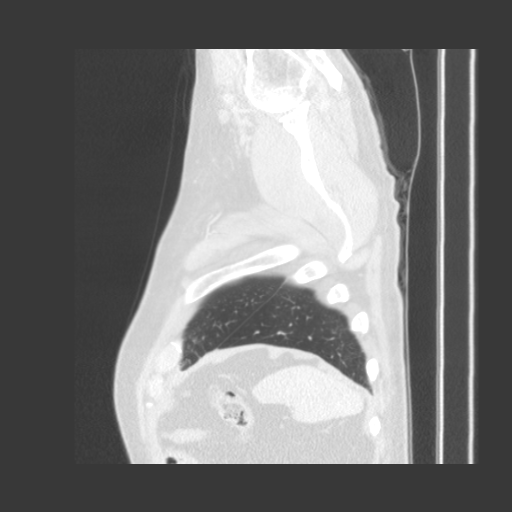
[im 160/180  lung]
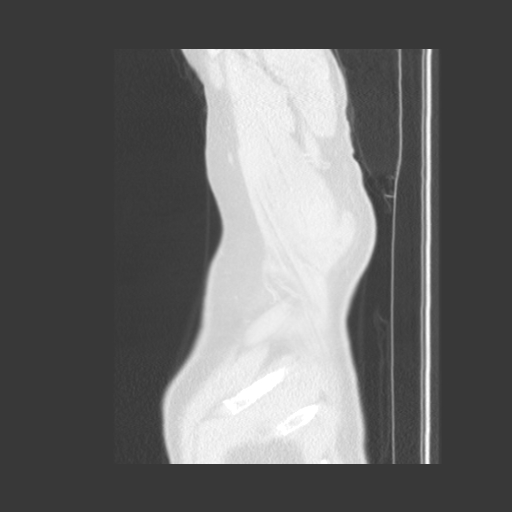
[im 173/180  lung]
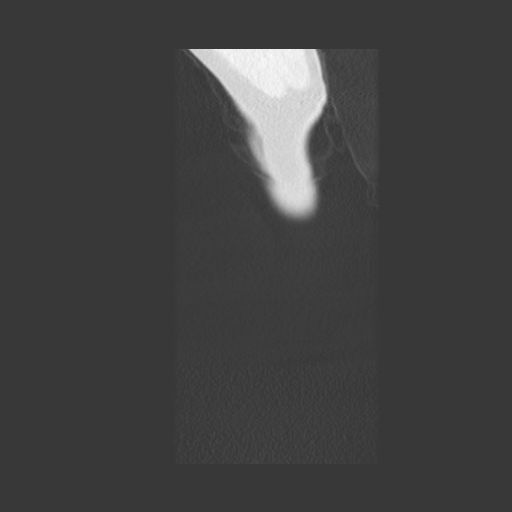

[15 of 32 positions shown; findings below may reference images not displayed]

FINDINGS: Small node or nodule in the anterior mediastinum is again
noted.  This measures up to 14 mm and is stable.  Scattered
coronary artery calcifications, best visualized in the left
anterior descending artery.  Heart is normal size.  Aorta is normal
caliber.

No hilar or axillary adenopathy.  Calcified granuloma at the left
lung base, stable.  No suspicious pulmonary nodules.  No pleural
effusions or focal opacities.

Visualized thyroid and chest wall soft tissues unremarkable.
Imaging into the upper abdomen shows no acute findings.
IMPRESSION: Stable 14 mm nodule in the anterior mediastinum/prevascular space.
Recommend continued follow up with repeat CT in 9-12 months.

Old granulomatous disease.

Coronary artery disease.

## 2014-12-07 ENCOUNTER — Other Ambulatory Visit: Payer: Self-pay

## 2015-05-02 IMAGING — CR DG KNEE COMPLETE 4+V*R*
4 series · 4 of 4 positions shown · non-contrast
Comparison: 03/07/2011

CLINICAL DATA: Chronic right knee pain

RIGHT KNEE - COMPLETE 4+ VIEW

[view not recorded (1 of 4)]
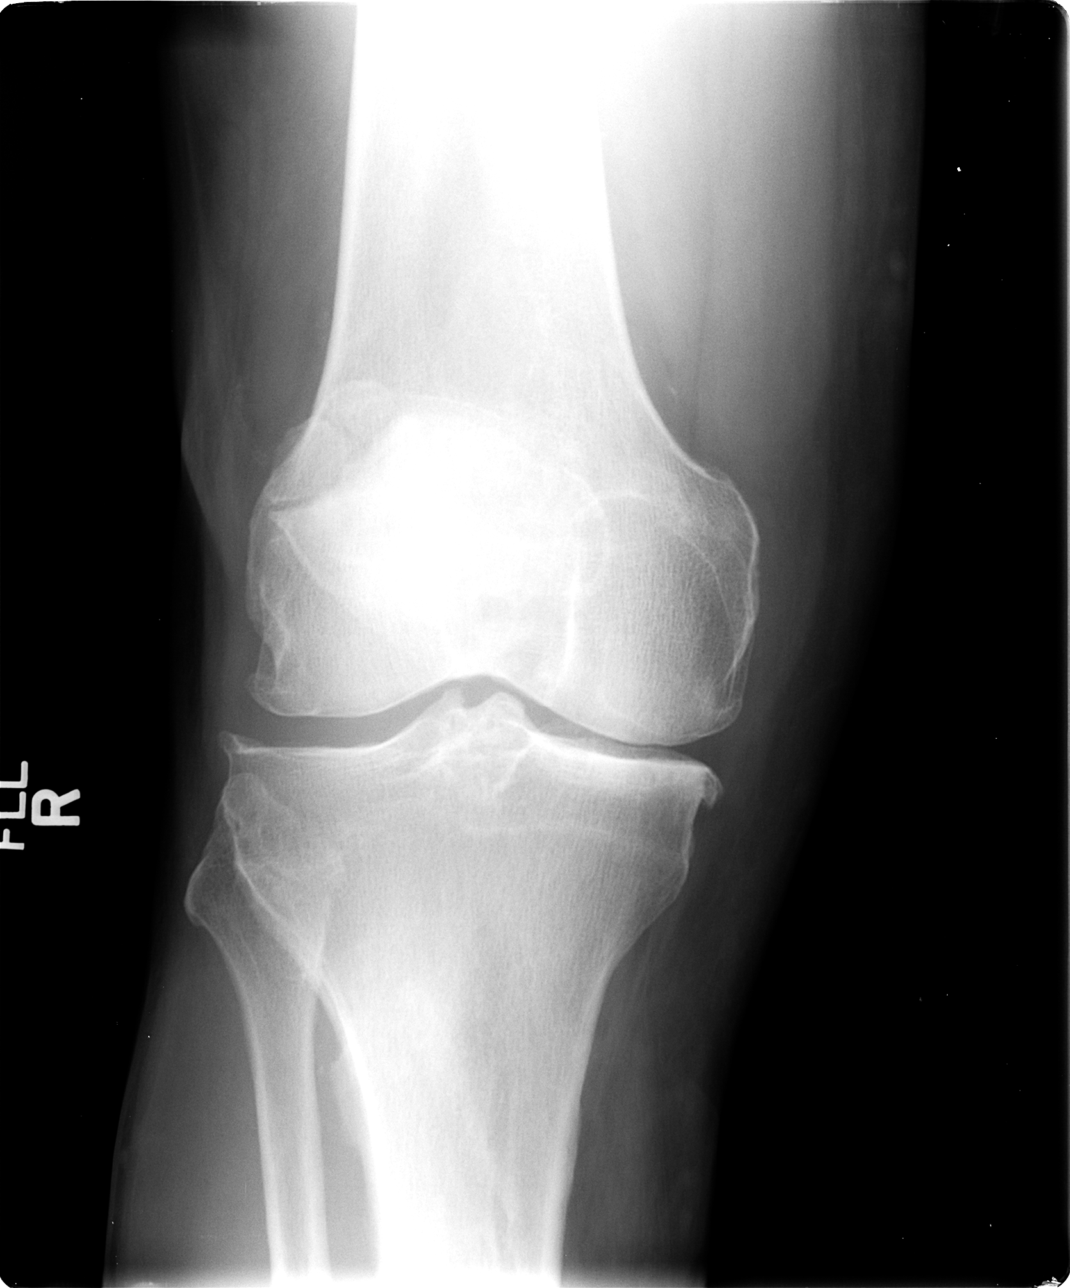

[view not recorded (2 of 4)]
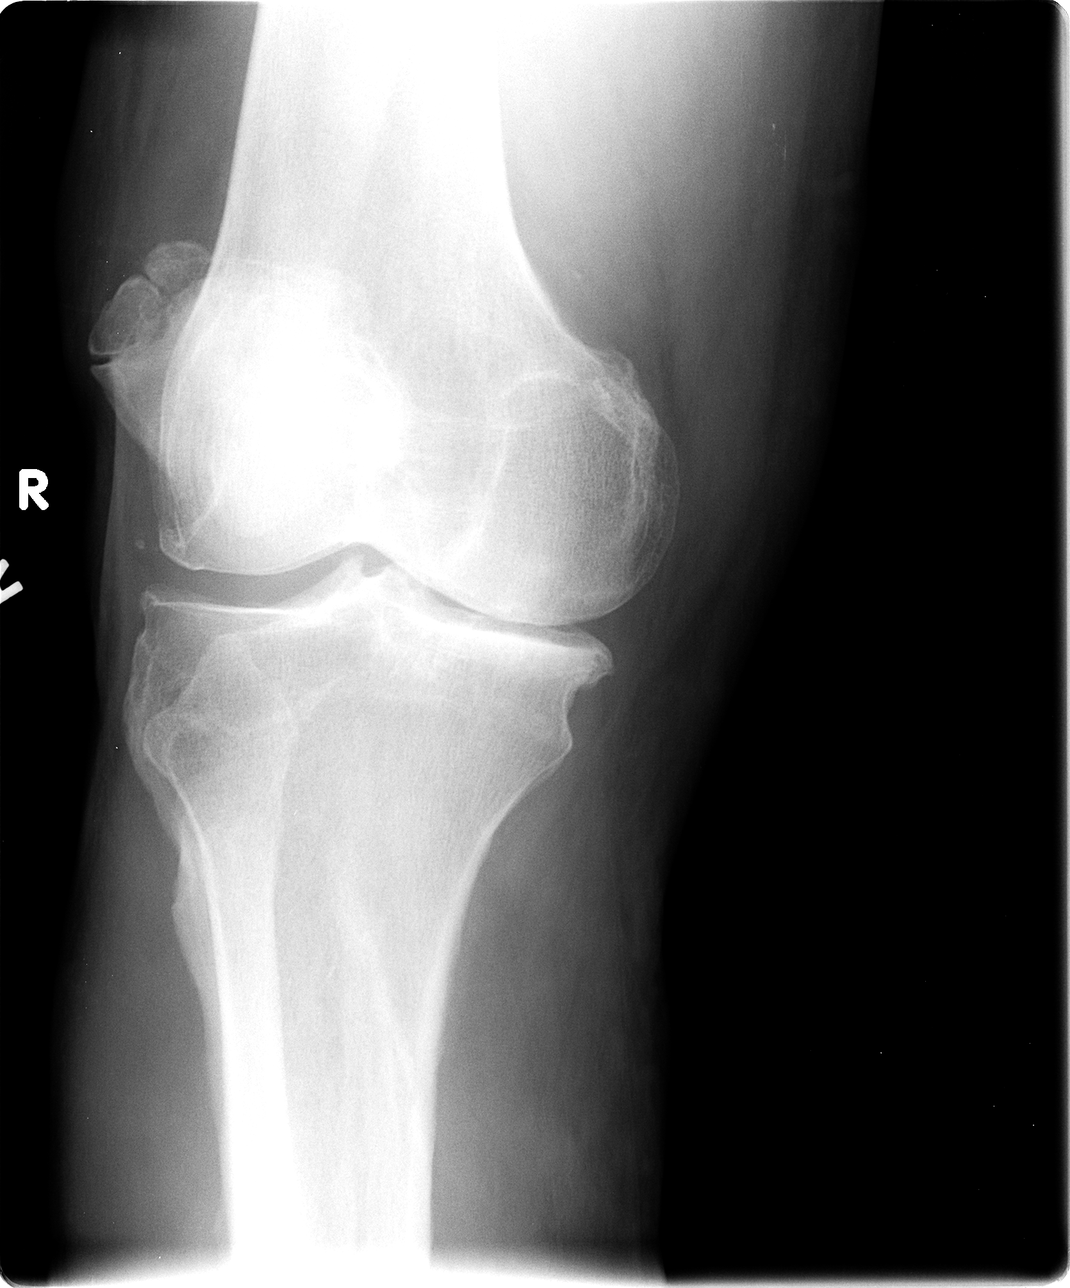

[view not recorded (3 of 4)]
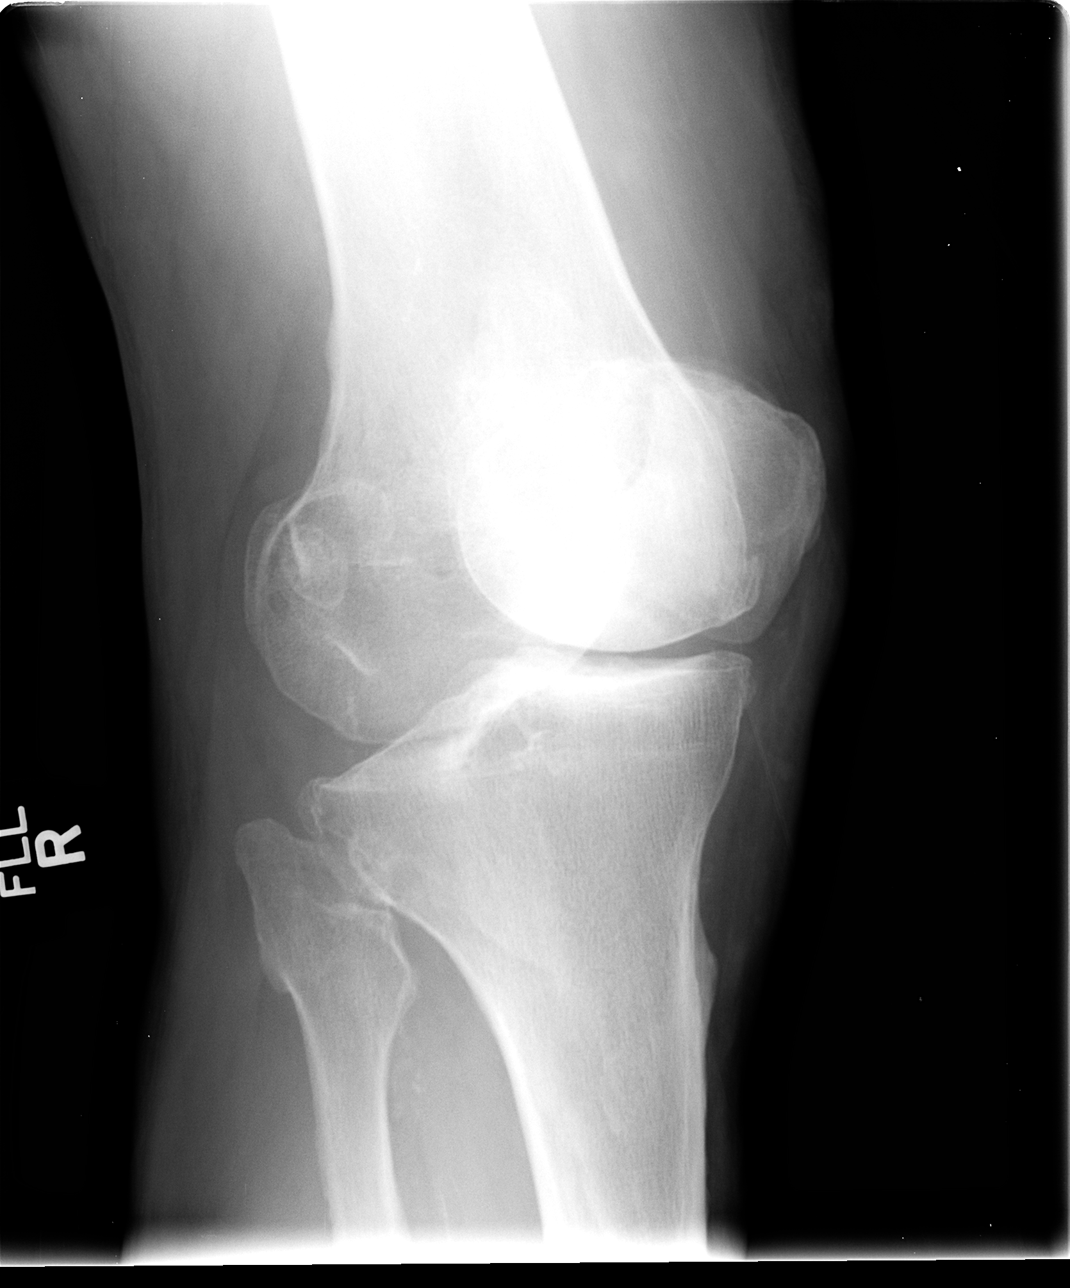

[view not recorded (4 of 4)]
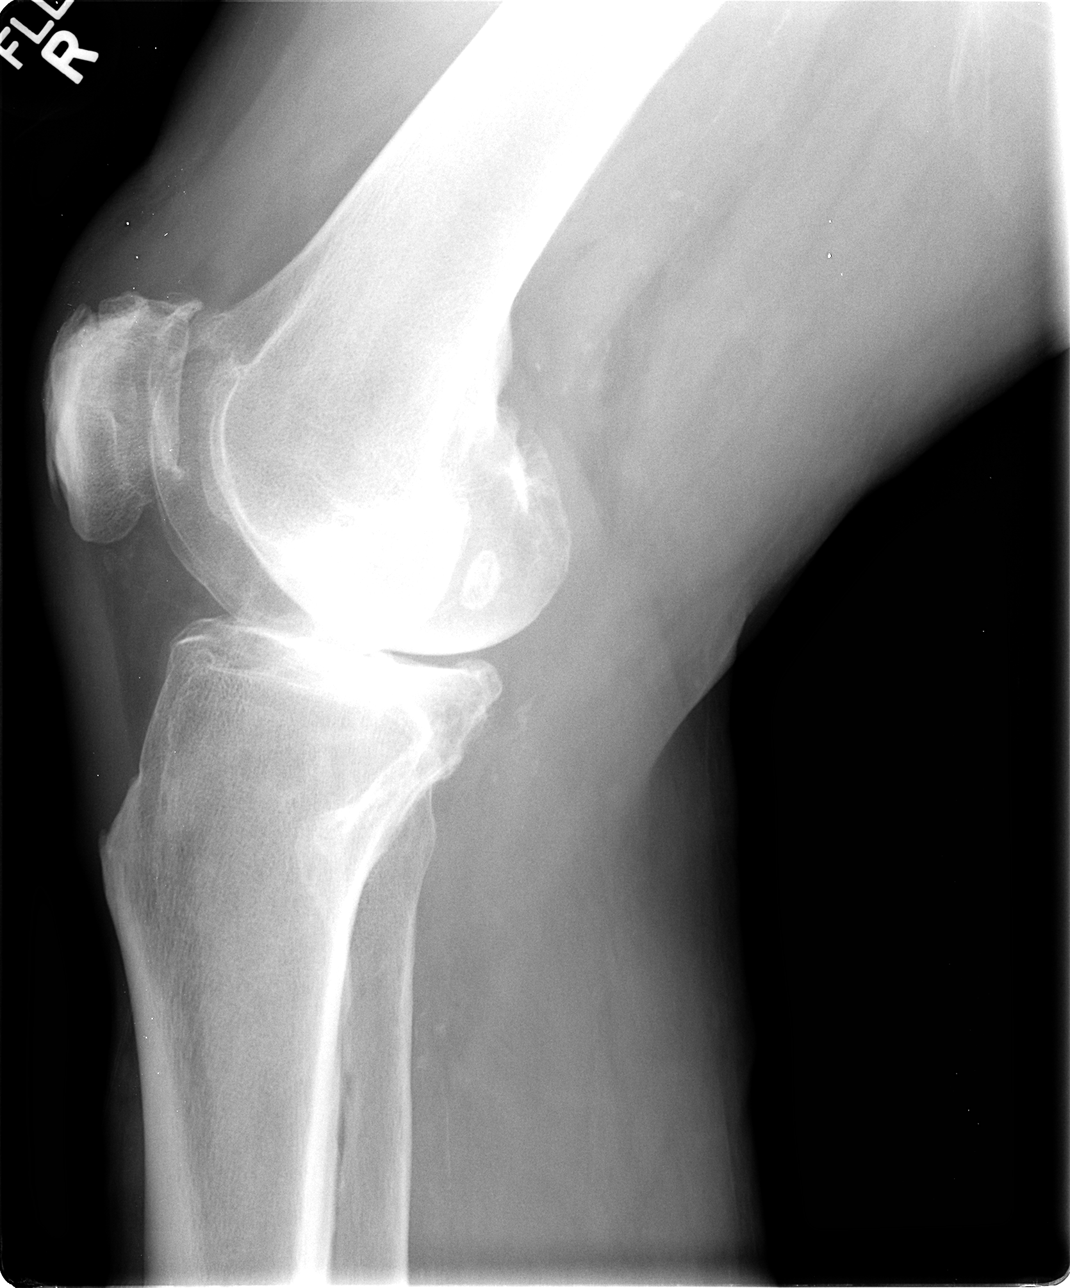

[4 of 4 positions shown; findings below may reference images not displayed]

FINDINGS: No fracture.

There are changes of osteoarthritis with joint space compartment
narrowing, mostly of the medial compartment, and marginal
osteophytes from all three compartments.  There are too accessory
ossification centers along the upper outer portion of the patella,
stable.

The bones are demineralized.  There is no joint effusion.  Vascular
calcifications are noted posteriorly.

The degenerative changes are stable from prior exam.
IMPRESSION: Osteoarthritis.  No fracture or acute finding.  No joint effusion.

## 2015-06-22 ENCOUNTER — Encounter (HOSPITAL_COMMUNITY): Payer: Self-pay | Admitting: *Deleted

## 2015-06-22 ENCOUNTER — Emergency Department (HOSPITAL_COMMUNITY)
Admission: EM | Admit: 2015-06-22 | Discharge: 2015-06-22 | Disposition: A | Discharge status: Home / Self Care | Payer: Medicare Other | Attending: Emergency Medicine | Admitting: Emergency Medicine

## 2015-06-22 ENCOUNTER — Emergency Department (HOSPITAL_COMMUNITY): Payer: Medicare Other

## 2015-06-22 DIAGNOSIS — G309 Alzheimer's disease, unspecified: Secondary | ICD-10-CM | POA: Insufficient documentation

## 2015-06-22 DIAGNOSIS — F0280 Dementia in other diseases classified elsewhere without behavioral disturbance: Secondary | ICD-10-CM | POA: Diagnosis not present

## 2015-06-22 DIAGNOSIS — Z7901 Long term (current) use of anticoagulants: Secondary | ICD-10-CM | POA: Diagnosis not present

## 2015-06-22 DIAGNOSIS — Y9389 Activity, other specified: Secondary | ICD-10-CM | POA: Insufficient documentation

## 2015-06-22 DIAGNOSIS — Z760 Encounter for issue of repeat prescription: Secondary | ICD-10-CM | POA: Diagnosis not present

## 2015-06-22 DIAGNOSIS — J45909 Unspecified asthma, uncomplicated: Secondary | ICD-10-CM | POA: Diagnosis not present

## 2015-06-22 DIAGNOSIS — N400 Enlarged prostate without lower urinary tract symptoms: Secondary | ICD-10-CM | POA: Insufficient documentation

## 2015-06-22 DIAGNOSIS — M1990 Unspecified osteoarthritis, unspecified site: Secondary | ICD-10-CM | POA: Insufficient documentation

## 2015-06-22 DIAGNOSIS — Z043 Encounter for examination and observation following other accident: Secondary | ICD-10-CM | POA: Diagnosis not present

## 2015-06-22 DIAGNOSIS — W1839XA Other fall on same level, initial encounter: Secondary | ICD-10-CM | POA: Diagnosis not present

## 2015-06-22 DIAGNOSIS — Z79899 Other long term (current) drug therapy: Secondary | ICD-10-CM | POA: Diagnosis not present

## 2015-06-22 DIAGNOSIS — W19XXXA Unspecified fall, initial encounter: Secondary | ICD-10-CM

## 2015-06-22 DIAGNOSIS — Z8673 Personal history of transient ischemic attack (TIA), and cerebral infarction without residual deficits: Secondary | ICD-10-CM | POA: Insufficient documentation

## 2015-06-22 DIAGNOSIS — I10 Essential (primary) hypertension: Secondary | ICD-10-CM | POA: Insufficient documentation

## 2015-06-22 DIAGNOSIS — Y998 Other external cause status: Secondary | ICD-10-CM | POA: Diagnosis not present

## 2015-06-22 DIAGNOSIS — E782 Mixed hyperlipidemia: Secondary | ICD-10-CM | POA: Diagnosis not present

## 2015-06-22 DIAGNOSIS — R531 Weakness: Secondary | ICD-10-CM | POA: Diagnosis present

## 2015-06-22 DIAGNOSIS — Y9289 Other specified places as the place of occurrence of the external cause: Secondary | ICD-10-CM | POA: Diagnosis not present

## 2015-06-22 DIAGNOSIS — Z7902 Long term (current) use of antithrombotics/antiplatelets: Secondary | ICD-10-CM | POA: Diagnosis not present

## 2015-06-22 DIAGNOSIS — E039 Hypothyroidism, unspecified: Secondary | ICD-10-CM | POA: Diagnosis not present

## 2015-06-22 LAB — URINALYSIS, ROUTINE W REFLEX MICROSCOPIC
Bilirubin Urine: NEGATIVE
Glucose, UA: NEGATIVE mg/dL
HGB URINE DIPSTICK: NEGATIVE
Ketones, ur: NEGATIVE mg/dL
LEUKOCYTES UA: NEGATIVE
Nitrite: NEGATIVE
Protein, ur: NEGATIVE mg/dL
Specific Gravity, Urine: 1.015 (ref 1.005–1.030)
pH: 6.5 (ref 5.0–8.0)

## 2015-06-22 LAB — CBC WITH DIFFERENTIAL/PLATELET
BASOS ABS: 0.1 10*3/uL (ref 0.0–0.1)
BASOS PCT: 1 %
EOS ABS: 0.2 10*3/uL (ref 0.0–0.7)
EOS PCT: 3 %
HCT: 45.4 % (ref 39.0–52.0)
Hemoglobin: 14.8 g/dL (ref 13.0–17.0)
Lymphocytes Relative: 20 %
Lymphs Abs: 1.1 10*3/uL (ref 0.7–4.0)
MCH: 30.2 pg (ref 26.0–34.0)
MCHC: 32.6 g/dL (ref 30.0–36.0)
MCV: 92.7 fL (ref 78.0–100.0)
Monocytes Absolute: 0.6 10*3/uL (ref 0.1–1.0)
Monocytes Relative: 10 %
Neutro Abs: 3.7 10*3/uL (ref 1.7–7.7)
Neutrophils Relative %: 66 %
PLATELETS: 209 10*3/uL (ref 150–400)
RBC: 4.9 MIL/uL (ref 4.22–5.81)
RDW: 14 % (ref 11.5–15.5)
WBC: 5.6 10*3/uL (ref 4.0–10.5)

## 2015-06-22 LAB — BASIC METABOLIC PANEL
Anion gap: 6 (ref 5–15)
BUN: 11 mg/dL (ref 6–20)
CO2: 28 mmol/L (ref 22–32)
Calcium: 9.2 mg/dL (ref 8.9–10.3)
Chloride: 106 mmol/L (ref 101–111)
Creatinine, Ser: 1.05 mg/dL (ref 0.61–1.24)
GFR calc Af Amer: >60 mL/min (ref >60)
GFR calc non Af Amer: >60 mL/min (ref >60)
Glucose, Bld: 107 mg/dL — ABNORMAL HIGH (ref 65–99)
POTASSIUM: 4.3 mmol/L (ref 3.5–5.1)
Sodium: 140 mmol/L (ref 135–145)

## 2015-06-22 MED ORDER — CLOPIDOGREL BISULFATE 75 MG PO TABS: 75.0000 mg | ORAL_TABLET | Freq: Every morning | ORAL | Status: AC

## 2015-06-22 MED ORDER — LEVOTHYROXINE SODIUM 137 MCG PO TABS: 137.0000 ug | ORAL_TABLET | Freq: Every morning | ORAL | Status: AC

## 2015-06-22 MED ORDER — DILTIAZEM HCL ER COATED BEADS 180 MG PO CP24: 180.0000 mg | ORAL_CAPSULE | Freq: Every morning | ORAL | Status: AC

## 2015-06-22 MED ORDER — ATORVASTATIN CALCIUM 40 MG PO TABS: 40.0000 mg | ORAL_TABLET | Freq: Every day | ORAL | Status: AC

## 2015-06-22 MED ORDER — DONEPEZIL HCL 5 MG PO TABS: 5.0000 mg | ORAL_TABLET | Freq: Every day | ORAL | Status: AC

## 2015-06-22 MED ORDER — FUROSEMIDE 20 MG PO TABS: 20.0000 mg | ORAL_TABLET | Freq: Every day | ORAL | Status: AC

## 2015-06-22 MED ORDER — RAMIPRIL 5 MG PO CAPS: 5.0000 mg | ORAL_CAPSULE | Freq: Every morning | ORAL | Status: AC

## 2015-06-22 NOTE — ED Notes (Signed)
MD at bedside. 

## 2015-06-22 NOTE — ED Notes (Signed)
Pt tolerated ambulation well.

## 2015-06-22 NOTE — Discharge Instructions (Signed)
If you were given medicines take as directed.  If you are on coumadin or contraceptives realize their levels and effectiveness is altered by many different medicines.  If you have any reaction (rash, tongues swelling, other) to the medicines stop taking and see a physician.    If your blood pressure was elevated in the ER make sure you follow up for management with a primary doctor or return for chest pain, shortness of breath or stroke symptoms.  Please follow up as directed and return to the ER or see a physician for new or worsening symptoms.  Thank you. Filed Vitals:   06/22/15 1130 06/22/15 1200 06/22/15 1230 06/22/15 1254  BP: 142/80 130/78 129/63   Pulse: 60  87   Temp:    97.9 F (36.6 C)  TempSrc:      Resp: 19 16 18    Height:      Weight:      SpO2: 97% 97% 93%

## 2015-06-22 NOTE — ED Notes (Signed)
Pt denies any pain at this time.

## 2015-06-22 NOTE — ED Provider Notes (Addendum)
CSN: [id number]     Arrival date & time 06/22/15  1048 History  By signing my name below, I, Erling Conte, attest that this documentation has been prepared under the direction and in the presence of Elnora Morrison, MD. Electronically Signed: Erling Conte, ED Scribe. 06/22/2015. 1:34 PM.     Chief Complaint  Patient presents with  . Weakness   The history is provided by the patient and the spouse. No language interpreter was used.    HPI Comments: Leonard Lindsey is a 82 y.o. male with a h/o dementia and stroke who presents to the Emergency Department complaining of moderate, right sided weakness onset this morning. Pt's wife states she looked outside and the pt was on all fours in the snow and when he got up he was leaning to the right side and rocking back and forth. Wife notes he was possibly out in the snow for ~20 minutes. Pt states this is his baseline mental status due to his dementia diagnosis. He denies any alleviating/aggravating factors. He has not had any medications prior to arrival aside from her normal daily medication. Wife denies any recent illness or infection. Pt denies any fevers, chills, chest pain, SOB, difficulty urinating, leg swelling, abdominal pain or HA.     Past Medical History  Diagnosis Date  . Essential hypertension, benign   . Hypothyroid     Following remote treatment of hyperthyroidism with radioactive iodine  . Mixed hyperlipidemia   . Prostatism   . Carotid artery disease ([place])     [id number] LICA 0000000  . Atrial septal aneurysm     TEE 11/12 with positive buble study - no definite ASD  . Stroke [facility])     Prior history and more recent - likely embolic in rright occipital parietal lobe 11/12  . Hyperlipidemia   . Asthma   . BPH (benign prostatic hyperplasia)   . Arthritis    Past Surgical History  Procedure Laterality Date  . Abdominal surgery      Tumor removal  . Tee without cardioversion  05/09/2011    Procedure: TRANSESOPHAGEAL  ECHOCARDIOGRAM (TEE);  Surgeon: Lelon Perla, MD;  Location: [facility] ENDOSCOPY;  Service: Cardiovascular;  Laterality: N/A;  . Prostate surgery    . Total knee arthroplasty Right 10/08/2013    Procedure: TOTAL KNEE ARTHROPLASTY;  Surgeon: Carole Civil, MD;  Location: [facility] ORS;  Service: Orthopedics;  Laterality: Right;   Family History  Problem Relation Age of Onset  . Anesthesia problems Neg Hx   . Hypotension Neg Hx   . Malignant hyperthermia Neg Hx   . Pseudochol deficiency Neg Hx    Social History  Substance Use Topics  . Smoking status: Never Smoker   . Smokeless tobacco: Never Used  . Alcohol Use: No    Review of Systems  Neurological: Positive for weakness.  All other systems reviewed and are negative.     Allergies  Review of patient's allergies indicates no known allergies.  Home Medications   Prior to Admission medications   Medication Sig Start Date End Date Taking? Authorizing Provider  atorvastatin (LIPITOR) 40 MG tablet Take 40 mg by mouth at bedtime.    Yes Historical Provider, MD  clopidogrel (PLAVIX) 75 MG tablet Take 75 mg by mouth every morning.    Yes Historical Provider, MD  diltiazem (CARTIA XT) 180 MG 24 hr capsule Take 180 mg by mouth every morning.   Yes Historical Provider, MD  donepezil (ARICEPT) 5 MG tablet Take  5 mg by mouth daily. 06/14/15  Yes Historical Provider, MD  dutasteride (AVODART) 0.5 MG capsule Take 0.5 mg by mouth at bedtime.    Yes Historical Provider, MD  furosemide (LASIX) 20 MG tablet Take 20 mg by mouth daily. 06/14/15  Yes Historical Provider, MD  levothyroxine (SYNTHROID, LEVOTHROID) 137 MCG tablet Take 137 mcg by mouth every morning.    Yes Historical Provider, MD  lidocaine (LIDODERM) 5 % Place 1 patch onto the skin 2 (two) times daily as needed (for pain). Remove & Discard patch within 12 hours or as directed by MD   Yes Historical Provider, MD  ramipril (ALTACE) 5 MG capsule Take 5 mg by mouth every morning.   Yes Historical  Provider, MD  Tamsulosin HCl (FLOMAX) 0.4 MG CAPS Take 0.4 mg by mouth at bedtime.    Yes Historical Provider, MD  traMADol (ULTRAM) 50 MG tablet Take 1 tablet (50 mg total) by mouth every 6 (six) hours as needed for moderate pain. [id number]  Yes Delora Fuel, MD  triamterene-hydrochlorothiazide (DYAZIDE) 37.5-25 MG per capsule Take 1 capsule by mouth every morning.    Yes Historical Provider, MD  zolpidem (AMBIEN) 10 MG tablet Take 10 mg by mouth at bedtime. 06/17/15  Yes Historical Provider, MD   Triage Vitals: BP 144/79 mmHg  Pulse 65  Temp(Src) 97.9 F (36.6 C) (Oral)  Resp 18  Ht 5\' 9"  (1.753 m)  Wt 230 lb (104.327 kg)  BMI 33.95 kg/m2  SpO2 97%  Physical Exam  Constitutional: He appears well-developed and well-nourished. No distress.  Pt is overall well appearing  HENT:  Head: Normocephalic and atraumatic.  Mouth/Throat: Oropharynx is clear and moist and mucous membranes are normal.  Eyes: Conjunctivae and EOM are normal. Pupils are equal, round, and reactive to light.  Neck: Neck supple. No tracheal deviation present.  Cardiovascular: Normal rate, regular rhythm and normal heart sounds.   Pulmonary/Chest: Effort normal. No respiratory distress.  Anterior lung fields are clear  Abdominal: Soft. There is no tenderness.  Musculoskeletal: Normal range of motion.  Neurological: He is alert.  Finger to nose intact Strength grossly intact in all extremities No leg drift Equal strenght in legs, 5+ grossly intact  Skin: Skin is warm and dry.  Psychiatric: He has a normal mood and affect.  Pleasant dementia  Nursing note and vitals reviewed.   ED Course  Procedures (including critical care time)  DIAGNOSTIC STUDIES: Oxygen Saturation is 97% on RA, normal by my interpretation.    COORDINATION OF CARE: 11:22 AM- Will order head CT and diagnostic lab work. Pt and wife advised of plan for treatment and pt and wife agree with plan.  Labs Review Labs Reviewed  BASIC METABOLIC  PANEL - Abnormal; Notable for the following:    Glucose, Bld 107 (*)    All other components within normal limits  URINE CULTURE  CBC WITH DIFFERENTIAL/PLATELET  URINALYSIS, ROUTINE W REFLEX MICROSCOPIC (NOT AT [facility])    Imaging Review Ct Head Wo Contrast  06/22/2015  CLINICAL DATA:  Right-sided weakness EXAM: CT HEAD WITHOUT CONTRAST TECHNIQUE: Contiguous axial images were obtained from the base of the skull through the vertex without intravenous contrast. COMPARISON:  10/13/2013 FINDINGS: Mild atrophic changes are noted. There are changes consistent with prior infarct in the posterior right parietal lobe stable from the prior exam. No findings to suggest acute hemorrhage, acute infarction or space-occupying mass lesion are noted. IMPRESSION: Chronic atrophic and ischemic changes without acute abnormality. Electronically Signed  By: Inez Catalina M.D.   On: 06/22/2015 12:00   I have personally reviewed and evaluated these images and lab results as part of my medical decision-making.   EKG Interpretation None     EKG heart rate 65, normal QT, sinus rhythm, no acute ST changes. Reviewed by myself. MDM   Final diagnoses:  Fall, initial encounter  Alzheimer's dementia   I personally performed the services described in this documentation, which was scribed in my presence. The recorded information has been reviewed and is accurate.  Patient presents after being found outside rocking on his hands and feet. No focal unilateral symptoms once he was standing. Patient had baseline currently with pleasant dementia.  No focal neurologic deficits except for confusion/dementia on exam. Screening CT had blood work and urinalysis unremarkable. Patient stable for outpatient follow-up. Patient is on blood thinners. Patient's doctor just retired. Refilled some of his medications for him. Instructed him to follow-up with the new physician notes covering the practice.  Results and differential diagnosis  were discussed with the patient/parent/guardian. Xrays were independently reviewed by myself.  Close follow up outpatient was discussed, comfortable with the plan.   Medications - No data to display  Filed Vitals:   06/22/15 1130 06/22/15 1200 06/22/15 1230 06/22/15 1254  BP: 142/80 130/78 129/63   Pulse: 60  87   Temp:    97.9 F (36.6 C)  TempSrc:      Resp: 19 16 18    Height:      Weight:      SpO2: 97% 97% 93%     Final diagnoses:  Fall, initial encounter  Alzheimer's dementia       Elnora Morrison, MD 06/22/15 1334  Elnora Morrison, MD 06/22/15 1402

## 2015-06-22 NOTE — ED Notes (Addendum)
Pt was found by his wife at 1000 leaning to the right side and rocking. Wife called 911. Pt is alert at this time with no deficits. Pt has hx of alzheimers.    Wife denies any new cough, N/V/D.

## 2015-06-23 LAB — URINE CULTURE: Culture: 5000

## 2016-06-12 DEATH — deceased
